# Patient Record
Sex: Female | Born: 1935 | Race: White | Hispanic: No | Smoking: Never smoker
Health system: Southern US, Community
[De-identification: ages and names within clinical notes are randomized; demographics above are authoritative.]

## PROBLEM LIST (undated history)

## (undated) DIAGNOSIS — I4891 Unspecified atrial fibrillation: Secondary | ICD-10-CM

## (undated) DIAGNOSIS — Z8719 Personal history of other diseases of the digestive system: Secondary | ICD-10-CM

## (undated) DIAGNOSIS — E785 Hyperlipidemia, unspecified: Secondary | ICD-10-CM

## (undated) DIAGNOSIS — K529 Noninfective gastroenteritis and colitis, unspecified: Secondary | ICD-10-CM

## (undated) DIAGNOSIS — I499 Cardiac arrhythmia, unspecified: Secondary | ICD-10-CM

## (undated) DIAGNOSIS — I10 Essential (primary) hypertension: Secondary | ICD-10-CM

## (undated) HISTORY — DX: Personal history of other diseases of the digestive system: Z87.19

## (undated) HISTORY — DX: Hyperlipidemia, unspecified: E78.5

## (undated) HISTORY — DX: Cardiac arrhythmia, unspecified: I49.9

## (undated) HISTORY — DX: Noninfective gastroenteritis and colitis, unspecified: K52.9

## (undated) HISTORY — DX: Essential (primary) hypertension: I10

---

## 1997-11-20 ENCOUNTER — Ambulatory Visit (HOSPITAL_BASED_OUTPATIENT_CLINIC_OR_DEPARTMENT_OTHER): Admission: RE | Admit: 1997-11-20 | Discharge: 1997-11-20 | Payer: Self-pay | Admitting: General Surgery

## 1999-06-18 ENCOUNTER — Emergency Department (HOSPITAL_COMMUNITY): Admission: EM | Admit: 1999-06-18 | Discharge: 1999-06-18 | Payer: Self-pay | Admitting: Emergency Medicine

## 1999-06-18 ENCOUNTER — Encounter: Payer: Self-pay | Admitting: Emergency Medicine

## 1999-07-04 ENCOUNTER — Ambulatory Visit (HOSPITAL_COMMUNITY): Admission: RE | Admit: 1999-07-04 | Discharge: 1999-07-04 | Payer: Self-pay | Admitting: Internal Medicine

## 1999-08-16 ENCOUNTER — Encounter: Admission: RE | Admit: 1999-08-16 | Discharge: 1999-08-16 | Payer: Self-pay | Admitting: Internal Medicine

## 1999-08-16 ENCOUNTER — Encounter: Payer: Self-pay | Admitting: Internal Medicine

## 2000-02-01 ENCOUNTER — Encounter: Admission: RE | Admit: 2000-02-01 | Discharge: 2000-02-01 | Payer: Self-pay | Admitting: General Surgery

## 2000-02-01 ENCOUNTER — Encounter: Payer: Self-pay | Admitting: General Surgery

## 2001-02-14 ENCOUNTER — Encounter: Admission: RE | Admit: 2001-02-14 | Discharge: 2001-02-14 | Payer: Self-pay | Admitting: General Surgery

## 2001-02-14 ENCOUNTER — Encounter: Payer: Self-pay | Admitting: General Surgery

## 2002-02-21 ENCOUNTER — Encounter: Payer: Self-pay | Admitting: Emergency Medicine

## 2002-02-21 ENCOUNTER — Inpatient Hospital Stay (HOSPITAL_COMMUNITY): Admission: EM | Admit: 2002-02-21 | Discharge: 2002-02-25 | Payer: Self-pay | Admitting: Emergency Medicine

## 2002-04-17 ENCOUNTER — Encounter: Payer: Self-pay | Admitting: Internal Medicine

## 2002-04-17 ENCOUNTER — Ambulatory Visit (HOSPITAL_COMMUNITY): Admission: RE | Admit: 2002-04-17 | Discharge: 2002-04-17 | Payer: Self-pay | Admitting: Internal Medicine

## 2002-06-11 ENCOUNTER — Encounter: Admission: RE | Admit: 2002-06-11 | Discharge: 2002-06-11 | Payer: Self-pay | Admitting: Internal Medicine

## 2002-06-11 ENCOUNTER — Encounter: Payer: Self-pay | Admitting: Internal Medicine

## 2002-08-04 ENCOUNTER — Ambulatory Visit (HOSPITAL_COMMUNITY): Admission: RE | Admit: 2002-08-04 | Discharge: 2002-08-04 | Payer: Self-pay | Admitting: Internal Medicine

## 2002-08-04 ENCOUNTER — Encounter: Payer: Self-pay | Admitting: Internal Medicine

## 2003-06-29 ENCOUNTER — Encounter: Admission: RE | Admit: 2003-06-29 | Discharge: 2003-06-29 | Payer: Self-pay | Admitting: Internal Medicine

## 2004-07-19 ENCOUNTER — Encounter: Admission: RE | Admit: 2004-07-19 | Discharge: 2004-07-19 | Payer: Self-pay | Admitting: Internal Medicine

## 2005-08-22 ENCOUNTER — Ambulatory Visit (HOSPITAL_COMMUNITY): Admission: RE | Admit: 2005-08-22 | Discharge: 2005-08-22 | Payer: Self-pay | Admitting: Internal Medicine

## 2005-09-07 ENCOUNTER — Encounter: Admission: RE | Admit: 2005-09-07 | Discharge: 2005-09-07 | Payer: Self-pay | Admitting: Internal Medicine

## 2005-11-23 ENCOUNTER — Other Ambulatory Visit: Admission: RE | Admit: 2005-11-23 | Discharge: 2005-11-23 | Payer: Self-pay | Admitting: Internal Medicine

## 2005-12-21 ENCOUNTER — Inpatient Hospital Stay (HOSPITAL_COMMUNITY): Admission: EM | Admit: 2005-12-21 | Discharge: 2005-12-26 | Payer: Self-pay | Admitting: Emergency Medicine

## 2005-12-25 ENCOUNTER — Ambulatory Visit: Payer: Self-pay | Admitting: Physical Medicine & Rehabilitation

## 2005-12-26 ENCOUNTER — Ambulatory Visit: Payer: Self-pay | Admitting: Family Medicine

## 2006-01-10 ENCOUNTER — Ambulatory Visit: Payer: Self-pay | Admitting: Family Medicine

## 2006-02-06 ENCOUNTER — Encounter: Admission: RE | Admit: 2006-02-06 | Discharge: 2006-02-06 | Payer: Self-pay | Admitting: Neurological Surgery

## 2006-03-06 ENCOUNTER — Encounter: Admission: RE | Admit: 2006-03-06 | Discharge: 2006-03-06 | Payer: Self-pay | Admitting: Anesthesiology

## 2006-04-03 ENCOUNTER — Encounter: Admission: RE | Admit: 2006-04-03 | Discharge: 2006-04-03 | Payer: Self-pay | Admitting: Neurological Surgery

## 2006-04-30 ENCOUNTER — Encounter: Admission: RE | Admit: 2006-04-30 | Discharge: 2006-04-30 | Payer: Self-pay | Admitting: Neurological Surgery

## 2006-09-12 ENCOUNTER — Encounter: Admission: RE | Admit: 2006-09-12 | Discharge: 2006-09-12 | Payer: Self-pay | Admitting: Internal Medicine

## 2006-09-21 ENCOUNTER — Encounter: Admission: RE | Admit: 2006-09-21 | Discharge: 2006-09-21 | Payer: Self-pay | Admitting: Internal Medicine

## 2007-10-03 ENCOUNTER — Encounter: Admission: RE | Admit: 2007-10-03 | Discharge: 2007-10-03 | Payer: Self-pay | Admitting: Internal Medicine

## 2007-10-17 HISTORY — PX: DOPPLER ECHOCARDIOGRAPHY: SHX263

## 2007-10-17 HISTORY — PX: OTHER SURGICAL HISTORY: SHX169

## 2007-11-07 ENCOUNTER — Inpatient Hospital Stay (HOSPITAL_COMMUNITY): Admission: EM | Admit: 2007-11-07 | Discharge: 2007-11-15 | Payer: Self-pay | Admitting: Emergency Medicine

## 2007-12-10 ENCOUNTER — Encounter (INDEPENDENT_AMBULATORY_CARE_PROVIDER_SITE_OTHER): Payer: Self-pay | Admitting: Cardiology

## 2007-12-10 ENCOUNTER — Ambulatory Visit (HOSPITAL_COMMUNITY): Admission: RE | Admit: 2007-12-10 | Discharge: 2007-12-10 | Payer: Self-pay | Admitting: Cardiology

## 2007-12-10 HISTORY — PX: TRANSESOPHAGEAL ECHOCARDIOGRAM: SHX273

## 2008-11-26 ENCOUNTER — Ambulatory Visit (HOSPITAL_COMMUNITY): Admission: RE | Admit: 2008-11-26 | Discharge: 2008-11-26 | Payer: Self-pay | Admitting: Internal Medicine

## 2009-04-19 ENCOUNTER — Encounter: Admission: RE | Admit: 2009-04-19 | Discharge: 2009-04-19 | Payer: Self-pay | Admitting: Family Medicine

## 2009-08-14 ENCOUNTER — Emergency Department (HOSPITAL_COMMUNITY): Admission: EM | Admit: 2009-08-14 | Discharge: 2009-08-14 | Payer: Self-pay | Admitting: Emergency Medicine

## 2009-09-11 ENCOUNTER — Emergency Department (HOSPITAL_COMMUNITY): Admission: EM | Admit: 2009-09-11 | Discharge: 2009-09-11 | Payer: Self-pay | Admitting: Family Medicine

## 2009-12-24 ENCOUNTER — Encounter: Admission: RE | Admit: 2009-12-24 | Discharge: 2009-12-24 | Payer: Self-pay | Admitting: Family Medicine

## 2010-06-16 IMAGING — CT CT ABD-PELV W/ CM
3 of 5 series · 12 of 36 positions shown, 19 images · IV contrast (water/omni  & 80ml omni 300)
Comparison: 12/23/2005

CLINICAL DATA: Left lower quadrant abdominal pain and flank pain.
Diarrhea and constipation.

CT ABDOMEN AND PELVIS WITH CONTRAST
TECHNIQUE: Multidetector CT imaging of the abdomen and pelvis was
performed following the standard protocol during bolus
administration of intravenous contrast.
Contrast: 80 ml Omniscan 300 IV contrast

[Series 2: routine abdomen · axial · 0.74mm/px · z∈[-387,-27]mm · 9 of 90 slices shown, 15 images]
[im 9/90  soft-tissue]
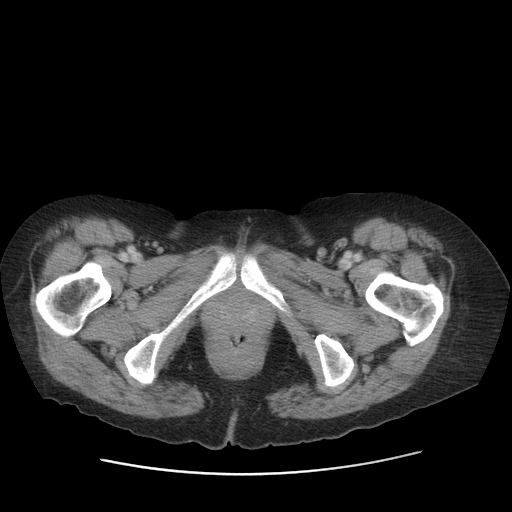
[im 9/90  bone]
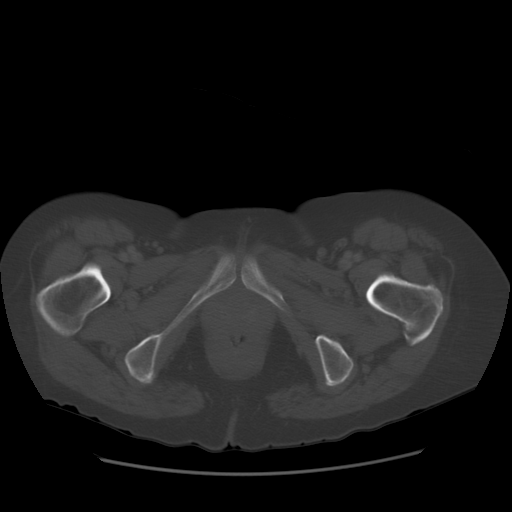
[im 18/90  soft-tissue]
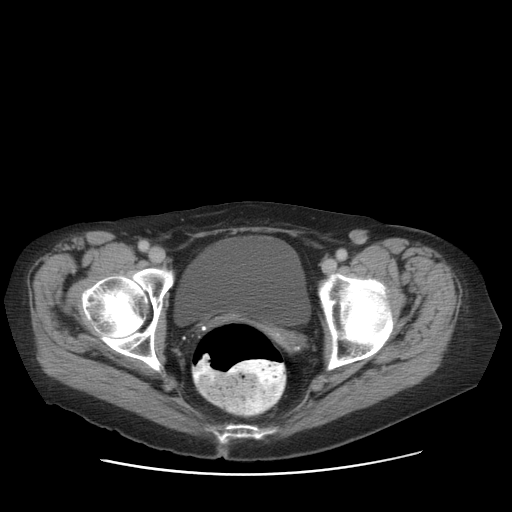
[im 27/90  soft-tissue]
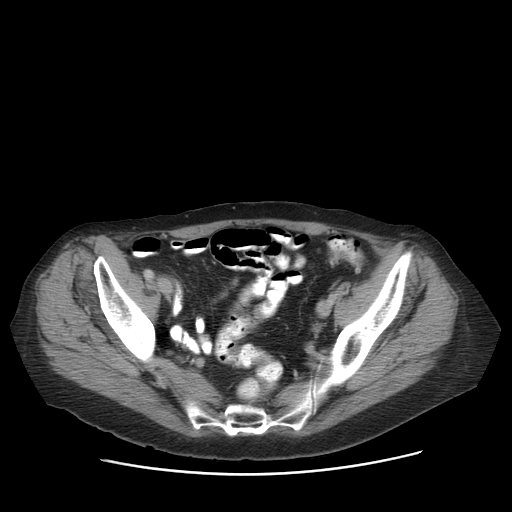
[im 36/90  soft-tissue]
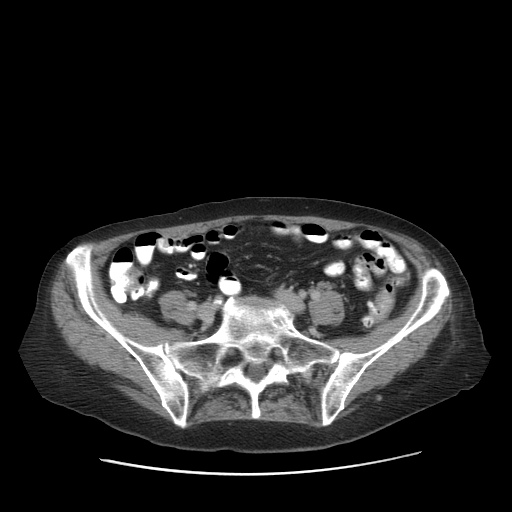
[im 45/90  soft-tissue]
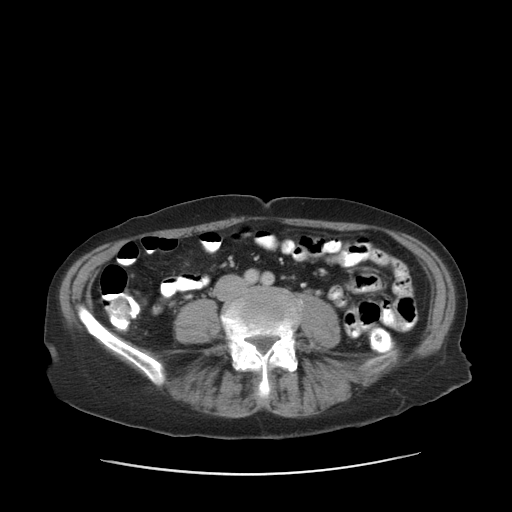
[im 54/90  soft-tissue]
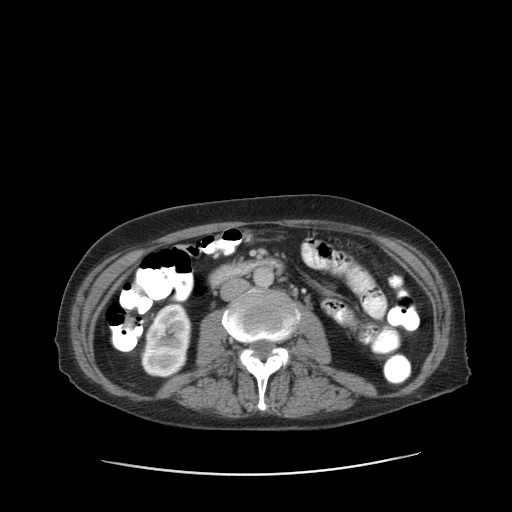
[im 54/90  lung]
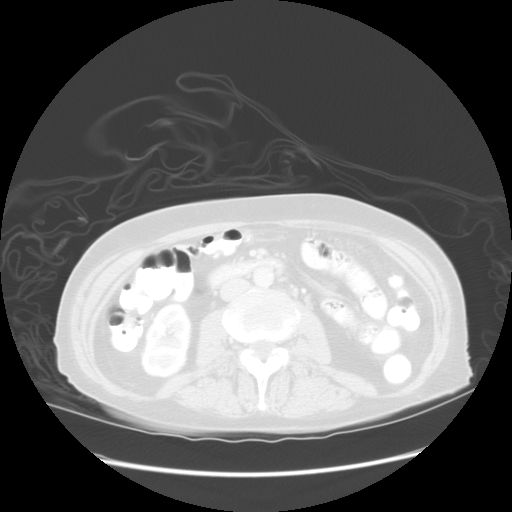
[im 63/90  soft-tissue]
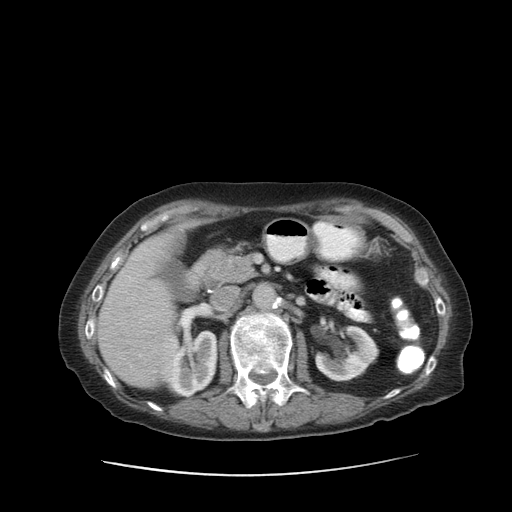
[im 63/90  lung]
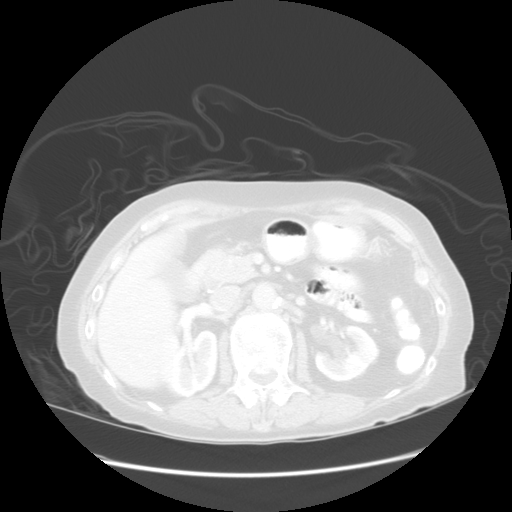
[im 72/90  soft-tissue]
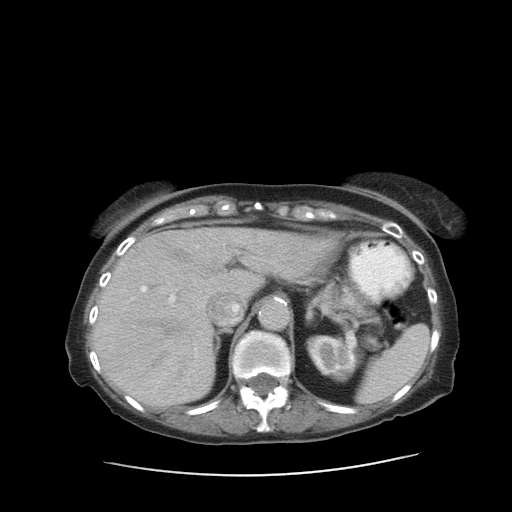
[im 72/90  lung]
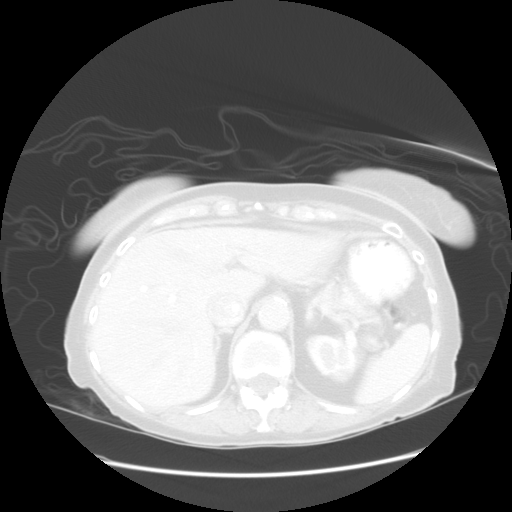
[im 81/90  soft-tissue]
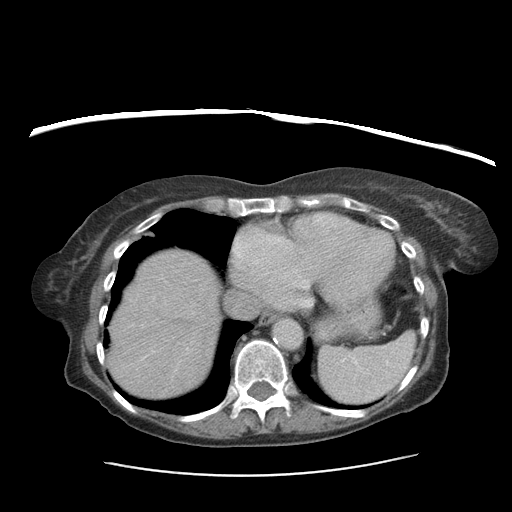
[im 81/90  lung]
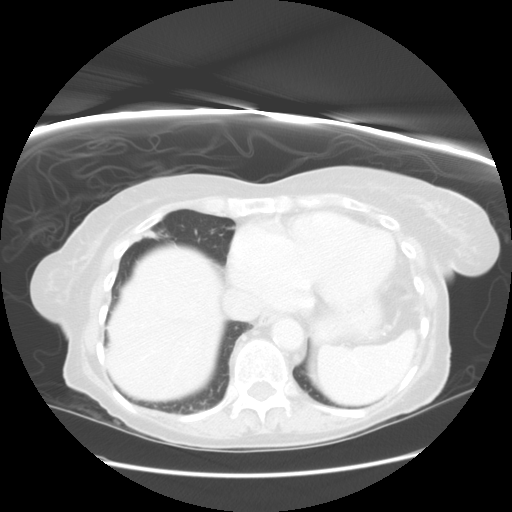
[im 81/90  bone]
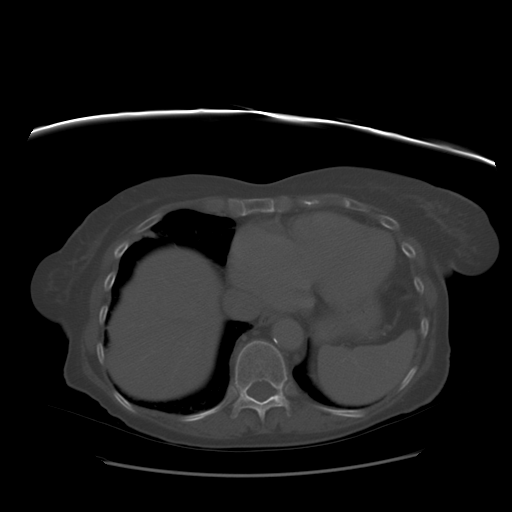

[Series 400: sag · sagittal · 0.89mm/px · 2 of 103 slices shown]
[im 9/103  soft-tissue]
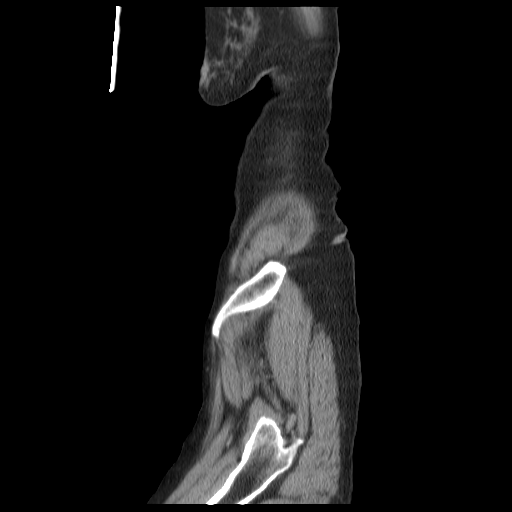
[im 26/103  soft-tissue]
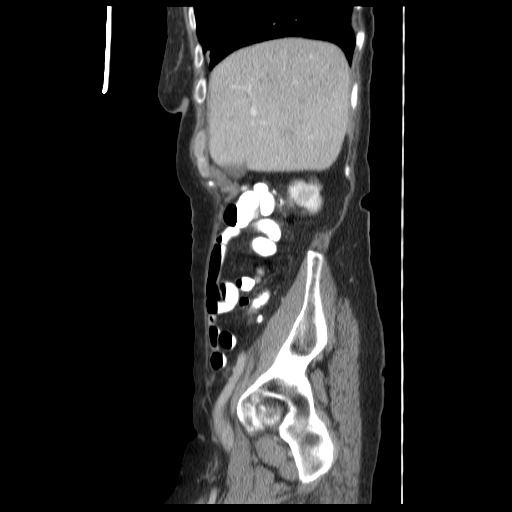

[Series 401: cor · coronal · 0.89mm/px · 1 of 70 slices shown, 2 images]
[im 24/70  soft-tissue]
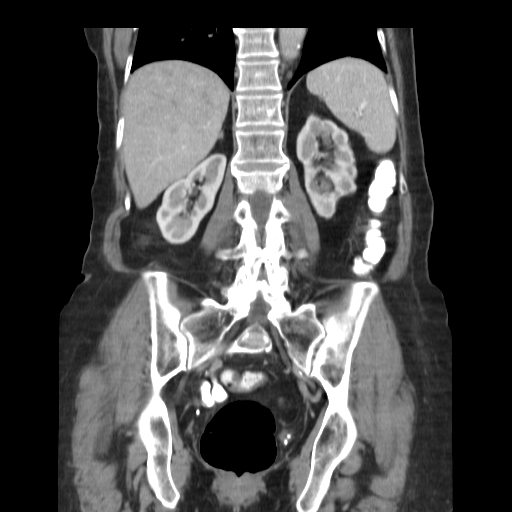
[im 24/70  bone]
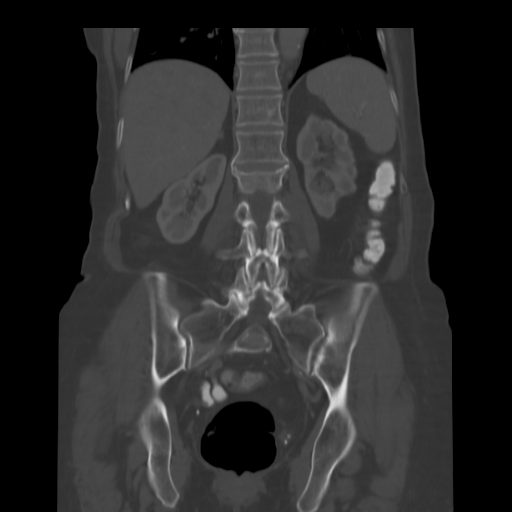

[12 of 36 positions shown; findings below may reference images not displayed]

FINDINGS: An area of tree in bud nodularity is noted in the right
middle lobe.  Linear left lower lobe scarring is noted.

Areas of left renal cortical scarring are again noted.  No
hydronephrosis.  The liver, gallbladder, spleen, pancreas, and
adrenal glands are unremarkable.  Small retroperitoneal lymph nodes
are within normal limits for size.  Cardiomegaly again noted.

Scattered atherosclerotic aortic calcification noted without
aneurysm.  No radiopaque renal or ureteral calculus.  Large volume
of stool noted within the rectum.  Scattered colonic diverticuli
noted without evidence for diverticulitis.  The appendix is normal.
Uterus is surgically absent.  Neither ovary is visualized.  No
acute bony abnormality.  Lumbar spine degenerative changes present.
L2 compression fracture again noted.
IMPRESSION: Large volume of stool in the rectal vault.  Otherwise no acute
finding in the abdomen or pelvis.

Tree in bud nodular opacities in the right middle lobe may reflect
small airways infection, possibly TRAITEUR.

## 2010-07-03 ENCOUNTER — Encounter: Payer: Self-pay | Admitting: Internal Medicine

## 2010-09-05 LAB — CBC
HCT: 41.5 % (ref 36.0–46.0)
MCV: 87.8 fL (ref 78.0–100.0)
Platelets: 160 10*3/uL (ref 150–400)
RDW: 14 % (ref 11.5–15.5)

## 2010-09-05 LAB — URINALYSIS, ROUTINE W REFLEX MICROSCOPIC
Ketones, ur: NEGATIVE mg/dL
Nitrite: NEGATIVE
Protein, ur: NEGATIVE mg/dL
Urobilinogen, UA: 0.2 mg/dL (ref 0.0–1.0)
pH: 8 (ref 5.0–8.0)

## 2010-09-05 LAB — DIFFERENTIAL
Basophils Absolute: 0 10*3/uL (ref 0.0–0.1)
Eosinophils Absolute: 0.1 10*3/uL (ref 0.0–0.7)
Eosinophils Relative: 3 % (ref 0–5)
Lymphs Abs: 1.6 10*3/uL (ref 0.7–4.0)
Monocytes Absolute: 0.3 10*3/uL (ref 0.1–1.0)

## 2010-09-05 LAB — BASIC METABOLIC PANEL
BUN: 14 mg/dL (ref 6–23)
CO2: 30 mEq/L (ref 19–32)
Chloride: 103 mEq/L (ref 96–112)
Creatinine, Ser: 1.06 mg/dL (ref 0.4–1.2)
Glucose, Bld: 102 mg/dL — ABNORMAL HIGH (ref 70–99)

## 2010-09-05 LAB — PROTIME-INR: Prothrombin Time: 21.1 seconds — ABNORMAL HIGH (ref 11.6–15.2)
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
## 2010-10-25 NOTE — Discharge Summary (Signed)
NAME:  Rebecca Bradshaw, HARRIES NO.:  [id number]   MEDICAL RECORD NO.:  [id number]          PATIENT TYPE:  INP   LOCATION:  1429                         FACILITY:  [facility]   PHYSICIAN:  Marcellus Scott, MD     DATE OF BIRTH:  05/08/1931   DATE OF ADMISSION:  11/06/2007  DATE OF DISCHARGE:  11/15/2007                               DISCHARGE SUMMARY   PRIMARY CARE PHYSICIAN:  Lucky Cowboy, M.D.   CARDIOLOGIST:  Cristy Hilts. Jacinto Halim, M.D.   DISCHARGE DIAGNOSES:  1. Chronic atrial fibrillation.  2. Iron Deficiency Anemia.  Gastrointestinal workup negative.  3. Weakness and deconditioning.  4. Hypothyroidism.  5. Essential tremor.  6. Rule out subclinical hypothyroidism.   DISCHARGE MEDICATIONS:  1. Pravastatin 40 mg p.o. daily.  2. Metoprolol 25 mg p.o. twice daily.  3. Aspirin 81 mg p.o. daily.  4. Meclizine 25 mg p.o. t.i.d. p.r.n.  5. Calcium 500 with vitamin D one p.o. daily.  6. Vitamin D3, 1000 international units p.o. daily.  7. Protonix 40 mg p.o. 2 times daily.  8. Amlodipine 5 mg p.o. daily.  9. Valium 2.5 mg p.o. t.i.d.  10.Coumadin 2.5 mg tablets, 5 mg p.o. at 6:00 p.m. on November 15, 2007 and      November 16, 2007; 2.5 mg on November 17, 2007 at 6:00 p.m.  Home health RN      will draw labs- INR, on November 18, 2007 and relay to Dr. Jacinto Halim for      further dosing instructions.   DISCONTINUED MEDICATIONS:  1. Verapamil.  2. Diazepam 5 mg.  3. Potassium chloride.  4. Bumex.   PROCEDURES:  1. EGD on November 12, 2007.  Normal study.  2. Colonoscopy on November 12, 2007.  Impression:  Diverticulosis,      otherwise normal colonoscopy.  3. CT of the head without contrast on Nov 06, 2007.  Mild diffuse      cortical atrophy.  No acute abnormality.  4. Chest x-ray on Nov 06, 2007.  Mild CHF with small effusions.   PERTINENT LABS:  INR today is 1.6.  CBC:  Hemoglobin 11.5, hematocrit  35, white blood cell 4.3, platelets 188.  Fecal occult blood testing was  negative.  Urine culture with  multiple bacteria, morphocytes.  Free T3  was 2.6, TSH was 5.170.  Anemia panel with B12 478, serum folate greater  than 20, ferritin 31.  TSH of 7.411 initially.   CONSULTATIONS:  1. Cardiology, Dr. Alanda Amass.  2. GI, Dr. Charlott Rakes.   HOSPITAL COURSE AND PATIENT DISPOSITION:  Please refer to the history  and physical note for initial admission details.  In summary, Ms.  Steffler is a 77 year old female patient with a history of a fib,  hypertension, hyperlipidemia, chronic vertigo, benign essential tremors  who presented to the emergency room with history of generalized weakness  for a couple of days.  This weakness was thought to be multifactorial,  including medications, mild congestive heart failure.  There was no  obvious sepsis.  The patient was thereby admitted for further evaluation  and management.   Cardiology  evaluated her and thought that she had a slow ventricular  response.  Her verapamil was decreased and subsequently discontinued.  She was also noted to have significant iron-deficiency anemia, though  she was Hemoccult negative following which the patient had a GI  evaluation done with a workup which was negative.  In the interim, she  was on IV heparin drip, and Coumadin and was reversed.  Once the  colonoscopy and EGD were completed, she was resumed on IV heparin drip,  and the Coumadin has been reinstated.  She is still not therapeutic on  the Coumadin.  I have discussed her case with Dr. Jacinto Halim who has  recommended that the IV heparin can be stopped, and she can be sent home  after a dose of Lovenox to slow anticoagulate as an outpatient.  He will  follow her INR on Monday, June 8 to be drawn by the home health RN.  This anemia might have been a contributory factor for her generalized  weakness.  At one point, cardiology even planned cardioversion but has  not been done, and this will be left to be achieved as an outpatient.  The patient is continued on her  Valium which she is getting for her  essential tremors.  The patient is tolerating low-dose beta blockers  with good control of her heart rate.  She has had PT/OT evaluate her.   The patient at this time is stable to be discharged home.  She will go  to stay with her daughter with 24/7 supervision and home health PT.  The  patient was noted to have elevated TSH but normal free T4 and T3 levels,  suspicious for subclinical hypothyroidism.  To consider starting her on  low-dose Synthroid as an outpatient.  Also, the patient is to get  diuretics on a p.r.n. basis.      Marcellus Scott, MD  Electronically Signed     AH/MEDQ  D:  11/15/2007  T:  11/15/2007  Job:  [id number]   cc:   Lucky Cowboy, M.D.  Fax: [phone]   Cristy Hilts. Jacinto Halim, MD  Fax: [phone]   Shirley Friar, MD  Fax: [phone]

## 2010-10-25 NOTE — Op Note (Signed)
NAME:  Rebecca Bradshaw, Rebecca Bradshaw NO.:  [id number]   MEDICAL RECORD NO.:  [id number]          PATIENT TYPE:  INP   LOCATION:  1429                         FACILITY:  [facility]   PHYSICIAN:  John C. Madilyn Fireman, M.D.    DATE OF BIRTH:  10/11/1930   DATE OF PROCEDURE:  11/12/2007  DATE OF DISCHARGE:                               OPERATIVE REPORT   INDICATIONS FOR PROCEDURE:  Anemia in a patient with no previous  colonoscopy, who is on Coumadin chronically.   PROCEDURE:  The patient was placed in the left lateral decubitus  position and placed on the pulse monitor, with continuous low-flow  oxygen delivered by nasal cannula.  She was sedated with 50 mcg IV  fentanyl and 7 mg IV Versed.  The Olympus video colonoscope was inserted  into the rectum and advanced to the cecum, confirmed by  transillumination at McBurney's point and visualization of ileocecal  valve and appendiceal orifice.  The prep was excellent.  The cecum,  ascending, transverse, and descending colon all appeared normal, with no  masses, polyps, diverticula, or other mucosal abnormalities.  Within the  sigmoid colon, there were a few scattered diverticula, no other  abnormalities.  The rectum appeared normal, and retroflexed view of the  anus revealed no obvious internal hemorrhoids.  The scope was then  withdrawn, and the patient returned to the recovery room in stable  condition.  She tolerated the procedure well.  There were no immediate  complications.   IMPRESSION:  Diverticulosis, otherwise normal colonoscopy.   PLAN:  Resume anticoagulation as needed.           ______________________________  Everardo All Madilyn Fireman, M.D.     JCH/MEDQ  D:  11/12/2007  T:  11/12/2007  Job:  [id number]
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
## 2010-10-25 NOTE — Op Note (Signed)
NAME:  Rebecca Bradshaw, Rebecca Bradshaw NO.:  [id number]   MEDICAL RECORD NO.:  [id number]          PATIENT TYPE:  INP   LOCATION:  1429                         FACILITY:  [facility]   PHYSICIAN:  John C. Madilyn Fireman, M.D.    DATE OF BIRTH:  05/15/1931   DATE OF PROCEDURE:  11/12/2007  DATE OF DISCHARGE:                               OPERATIVE REPORT   PROCEDURE:  Esophagogastroduodenoscopy.   INDICATIONS FOR PROCEDURE:  Anemia in a patient on chronic Coumadin.   PROCEDURE:  The patient was placed in the left lateral decubitus  position and placed on the pulse monitor, with continuous low-flow  oxygen delivered by nasal cannula.  She was sedated with 50 mcg IV  fentanyl and 7 mg IV Versed.  The Olympus video endoscope was advanced  under direct vision into the oropharynx and esophagus.  The esophagus  was straight and of normal caliber, with the squamocolumnar line at 38  cm.  There was no visible hiatal hernia, ring, stricture, or other  abnormality of the GE junction.  The stomach was entered, and a small  amount of liquid secretions were suctioned from the fundus.  Retroflexed  view of the cardia was unremarkable.  The fundus, body, antrum, and  pylorus all appeared normal.  The duodenum was entered, and both the  bulb and second portion were well inspected and appeared to be within  normal limits.  The scope was then withdrawn and the patient prepared  for colonoscopy.  She tolerated the procedure well, and there were no  immediate complications.   IMPRESSION:  Normal study.           ______________________________  Everardo All Madilyn Fireman, M.D.     JCH/MEDQ  D:  11/12/2007  T:  11/12/2007  Job:  [id number]
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
## 2010-10-28 NOTE — H&P (Signed)
NAME:  AAMORI, MCMASTERS NO.:  [id number]   MEDICAL RECORD NO.:  [id number]                   PATIENT TYPE:  INP   LOCATION:  3016                                 FACILITY:  [facility]   PHYSICIAN:  Theressa Millard, MD                   DATE OF BIRTH:  29-Aug-1930   DATE OF ADMISSION:  02/21/2002  DATE OF DISCHARGE:                                HISTORY & PHYSICAL   CHIEF COMPLAINT:  Vertigo/dizziness to the extent that she can not sit up  because of that symptom.  It is associated with nausea.  They stopped her  hormone replacement therapy three weeks ago.  She has been acutely dizzy  since Tuesday.  She also has a headache in the back of her head and neck.   HISTORY OF PRESENT ILLNESS:  Since Tuesday, a flare of what is felt to be  due to inner ear disturbance.  No improvement on Valium 5 milligrams t.i.d.,  so she was brought in for further evaluation.   DRUG ALLERGIES:  Codeine caused nausea.   CURRENT MEDICATIONS:  Verapamil 240 one q.d., Bumex 1 milligram q.d.,  Atenolol 50 milligrams one q.d., Os-Cal D, Aspirin 81 milligrams one q.d.,  Primidone 50 milligrams one-half tablet q.h.s.   PAST MEDICAL HISTORY:  1. Hypertension.  2. Fibrocystic breast disease.  3. Renal lithiasis.  4. Inner ear problem (Dr. Jearld Fenton), benign positional vertigo, chronic sinus     problem, status-post sinus surgery in 1997.  5. Gastroesophageal reflux disease, mild.  6. Varicose veins.   PAST SURGICAL HISTORY:  1. Total abdominal hysterectomy, bilateral salpingo-oophorectomy in 1999     (fibroid uterus).  2. Breast biopsy right, benign, in 1999 (Dr. Francina Ames).  3. Sinus surgery in 1997 (Dr. Jearld Fenton).   FAMILY HISTORY:  Father died of a myocardial infarction and hypertension at  age 5.  Mother died of breast cancer at age 84.  She has three sisters, one  is deceased.  One has diabetes.  Two others also are diabetic.  One brother  is diabetic.  One brother has  melanoma.   HABITS:  Smoking none.  Drugs/alcohol none.   SOCIAL HISTORY:  Married with two children.  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   REVIEW OF SYSTEMS:  Please see chief complaint with no other additional  problems.   PHYSICAL EXAMINATION:  GENERAL APPEARANCE:  Obese elderly female unable to  sit up due to vertigo.  VITAL SIGNS:  Blood pressure 148/92, pulse 68, respirations 24, temperature  98.6, height 5'9, weight 200 pounds.  HEAD, EYES, EARS, NOSE, THROAT:  Pupils round and reactive to light.  Extraocular movements are intact.  Funduscopic normal.  Ears revealed  external auditory canals/tympanic membranes clear.  Oropharynx is moist.  NECK:  Supple.  CHEST:  Clear to percussion and auscultation.  HEART:  Regular rate and rhythm without S3 or S4.  No murmurs, rubs,  gallops, or clicks.  ABDOMEN:  Benign.  Bowel sounds present.  BREASTS:  No dominant masses or nipple discharge.  GYN:  Deferred.  RECTAL:  Deferred.  EXTREMITIES:  No edema.  NEUROLOGIC:  Alert and oriented times three.  Cranial nerves II through XII  intact.  Vertigo upon sitting (severe ).   ASSESSMENT:  Vertigo, questionable etiology.  Admit per orders of Dr. Theressa Millard to include a CT of the brain noncontrast.     Lavinia Sharps, NP                       Theressa Millard, MD    MAP/MEDQ  D:  02/21/2002  T:  02/23/2002  Job:  [id number]
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
## 2010-10-28 NOTE — Discharge Summary (Signed)
   NAME:  Rebecca Bradshaw, Rebecca Bradshaw                        ACCOUNT NO.:  [id number]   MEDICAL RECORD NO.:  [id number]                   PATIENT TYPE:  INP   LOCATION:  3016                                 FACILITY:  [facility]   PHYSICIAN:  Georgann Housekeeper, M.D.                 DATE OF BIRTH:  11/26/1930   DATE OF ADMISSION:  02/21/2002  DATE OF DISCHARGE:  02/25/2002                                 DISCHARGE SUMMARY   CONDITION ON DISCHARGE:  Stable.   DISCHARGE DIAGNOSES:  1. 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  2. Maxillary sinusitis.   DISCHARGE MEDICATIONS:  1. Verapamil 240 mg q.d.  2. Bumex 1 mg q.d.  3. Tenormin 50 mg q.d.  4. Aspirin 81 mg q.d.  5. Tequin 400 mg once a day x7 days.  6. Prednisone taper 40 mg x2 days, 20 mg x2 days and 10 mg x2 days.  7. Afrin nasal spray once twice a day x3 days as needed.  8. Valium 5 mg q.8h. p.r.n.  9. Antevert 25 mg q.8h. as needed.  10.      Potassium 10 mEq once a day.   ACTIVITY:  As tolerated.  Assistance with walking.   FOLLOW UP:  Follow-up appointment with Dr. Jearld Fenton, ENT, in two weeks.  Follow  up with Dr. Donette Larry in one week.   HOSPITAL COURSE:  The patient is 77 years old with history of vertigo in the  past and worked up by Dr. Jearld Fenton in 1997 and 1998.  He started having  symptoms of vertigo with dizziness, unable to stand up lasting more than  three to four days.  The patient was seen in the clinic and admitted for the  severe vertigo and nausea.  Her lab work was unremarkable except her  potassium was 3.1 which was replaced.  She underwent scan of the head and CT  was negative except for maxillary sinusitis.  She was also evaluated by ENT  and started on antibiotics and prednisone thought to be secondary to  vestibular neuronitis and sinusitis and agrees with the supportive care.  The patient has improved with prednisone, valium and antibiotics.  She is  discharged home with a follow-up appointment with Dr. Jearld Fenton as an  outpatient.  Regarding the patient's hypertension, the patient remained  stable with current medications for blood pressure.  She had a history of  essential tremors.                                                Georgann Housekeeper, M.D.    KH/MEDQ  D:  03/20/2002  T:  03/23/2002  Job:  [id number]

## 2010-12-22 ENCOUNTER — Other Ambulatory Visit (HOSPITAL_COMMUNITY): Payer: Self-pay | Admitting: Family Medicine

## 2010-12-22 ENCOUNTER — Other Ambulatory Visit: Payer: Self-pay | Admitting: Family Medicine

## 2010-12-22 DIAGNOSIS — Z1231 Encounter for screening mammogram for malignant neoplasm of breast: Secondary | ICD-10-CM

## 2011-01-05 ENCOUNTER — Ambulatory Visit
Admission: RE | Admit: 2011-01-05 | Discharge: 2011-01-05 | Disposition: A | Discharge status: Home / Self Care | Payer: MEDICARE | Source: Ambulatory Visit | Attending: Family Medicine | Admitting: Family Medicine

## 2011-01-05 DIAGNOSIS — Z1231 Encounter for screening mammogram for malignant neoplasm of breast: Secondary | ICD-10-CM

## 2011-03-08 LAB — CBC
HCT: 28.4 — ABNORMAL LOW
HCT: 28.8 — ABNORMAL LOW
HCT: 29.9 — ABNORMAL LOW
HCT: 30 — ABNORMAL LOW
HCT: 32.3 — ABNORMAL LOW
Hemoglobin: 10.1 — ABNORMAL LOW
Hemoglobin: 10.6 — ABNORMAL LOW
Hemoglobin: 9.5 — ABNORMAL LOW
Hemoglobin: 9.8 — ABNORMAL LOW
MCHC: 32.8
MCHC: 34.6
MCV: 82.1
MCV: 83.6
RBC: 3.43 — ABNORMAL LOW
RBC: 3.52 — ABNORMAL LOW
RBC: 3.56 — ABNORMAL LOW
RBC: 3.63 — ABNORMAL LOW
RBC: 3.86 — ABNORMAL LOW
RDW: 14.2
WBC: 4.6
WBC: 4.7

## 2011-03-08 LAB — COMPREHENSIVE METABOLIC PANEL
ALT: 28
AST: 29
CO2: 23
Calcium: 8.2 — ABNORMAL LOW
Chloride: 107
GFR calc non Af Amer: 47 — ABNORMAL LOW
Glucose, Bld: 96
Sodium: 138
Total Bilirubin: 0.5

## 2011-03-08 LAB — DIFFERENTIAL
Basophils Relative: 0
Eosinophils Absolute: 0.1
Eosinophils Relative: 3
Eosinophils Relative: 3
Eosinophils Relative: 4
Lymphocytes Relative: 28
Lymphocytes Relative: 31
Lymphs Abs: 1.4
Monocytes Absolute: 0.5
Monocytes Absolute: 0.5
Monocytes Relative: 10
Monocytes Relative: 11
Monocytes Relative: 11
Neutro Abs: 2.5
Neutro Abs: 3.1
Neutrophils Relative %: 53

## 2011-03-08 LAB — POCT I-STAT, CHEM 8
Calcium, Ion: 1.09 — ABNORMAL LOW
Creatinine, Ser: <0.2 — ABNORMAL LOW
Glucose, Bld: 118 — ABNORMAL HIGH
HCT: 30 — ABNORMAL LOW
Hemoglobin: 10.2 — ABNORMAL LOW
Potassium: 4.4
TCO2: 10

## 2011-03-08 LAB — PROTIME-INR
INR: 2.8 — ABNORMAL HIGH
INR: 2.9 — ABNORMAL HIGH
INR: 3 — ABNORMAL HIGH
Prothrombin Time: 21.3 — ABNORMAL HIGH
Prothrombin Time: 30.7 — ABNORMAL HIGH

## 2011-03-08 LAB — HEPATIC FUNCTION PANEL
ALT: 30
Alkaline Phosphatase: 53
Bilirubin, Direct: 0.1
Indirect Bilirubin: 0.4
Total Bilirubin: 0.5
Total Protein: 5.7 — ABNORMAL LOW

## 2011-03-08 LAB — CARDIAC PANEL(CRET KIN+CKTOT+MB+TROPI)
CK, MB: 0.7
Relative Index: INVALID
Total CK: 44
Total CK: 53
Troponin I: <0.01

## 2011-03-08 LAB — IRON AND TIBC
Saturation Ratios: 9 — ABNORMAL LOW
TIBC: 373
UIBC: 340

## 2011-03-08 LAB — FERRITIN: Ferritin: 31 (ref 10–291)

## 2011-03-08 LAB — URINALYSIS, ROUTINE W REFLEX MICROSCOPIC
Bilirubin Urine: NEGATIVE
Hgb urine dipstick: NEGATIVE
Specific Gravity, Urine: 1.019
Urobilinogen, UA: 0.2
pH: 6

## 2011-03-08 LAB — POCT CARDIAC MARKERS: CKMB, poc: <1 — ABNORMAL LOW

## 2011-03-08 LAB — B-NATRIURETIC PEPTIDE (CONVERTED LAB): Pro B Natriuretic peptide (BNP): 75.4

## 2011-03-08 LAB — FOLATE: Folate: >20

## 2011-03-08 LAB — TSH: TSH: 5.17

## 2011-03-08 LAB — OCCULT BLOOD X 1 CARD TO LAB, STOOL: Fecal Occult Bld: NEGATIVE

## 2011-03-08 LAB — CK TOTAL AND CKMB (NOT AT ARMC): CK, MB: 0.5

## 2011-03-08 LAB — URINE CULTURE

## 2011-03-08 LAB — MAGNESIUM: Magnesium: 2.1

## 2011-03-08 LAB — RETICULOCYTES: Retic Ct Pct: 0.9

## 2011-03-09 LAB — PROTIME-INR
INR: 1.4
INR: 1.6 — ABNORMAL HIGH
INR: 1.6 — ABNORMAL HIGH
INR: 1.6 — ABNORMAL HIGH
Prothrombin Time: 16.9 — ABNORMAL HIGH
Prothrombin Time: 31.6 — ABNORMAL HIGH

## 2011-03-09 LAB — CBC
HCT: 34 — ABNORMAL LOW
HCT: 34.6 — ABNORMAL LOW
HCT: 34.8 — ABNORMAL LOW
HCT: 35.4 — ABNORMAL LOW
Hemoglobin: 11.4 — ABNORMAL LOW
Hemoglobin: 11.5 — ABNORMAL LOW
Hemoglobin: 11.5 — ABNORMAL LOW
MCV: 82.6
Platelets: 186
Platelets: 188
Platelets: 195
RBC: 4.15
RDW: 14.2
WBC: 3.9 — ABNORMAL LOW
WBC: 4.3
WBC: 4.3
WBC: 4.6
WBC: 5.6

## 2011-03-09 LAB — BASIC METABOLIC PANEL
CO2: 27
Chloride: 104
GFR calc non Af Amer: 52 — ABNORMAL LOW
Glucose, Bld: 124 — ABNORMAL HIGH
Potassium: 3.8
Sodium: 139

## 2011-03-09 LAB — HEPARIN LEVEL (UNFRACTIONATED)
Heparin Unfractionated: 0.47
Heparin Unfractionated: 0.52
Heparin Unfractionated: 0.63

## 2011-03-09 LAB — OCCULT BLOOD X 1 CARD TO LAB, STOOL: Fecal Occult Bld: NEGATIVE

## 2011-06-21 HISTORY — PX: NM MYOVIEW LTD: HXRAD82

## 2012-01-17 ENCOUNTER — Other Ambulatory Visit: Payer: Self-pay | Admitting: Family Medicine

## 2012-01-17 DIAGNOSIS — Z1231 Encounter for screening mammogram for malignant neoplasm of breast: Secondary | ICD-10-CM

## 2012-01-25 ENCOUNTER — Ambulatory Visit
Admission: RE | Admit: 2012-01-25 | Discharge: 2012-01-25 | Disposition: A | Discharge status: Home / Self Care | Payer: Medicare Other | Source: Ambulatory Visit | Attending: Family Medicine | Admitting: Family Medicine

## 2012-01-25 DIAGNOSIS — Z1231 Encounter for screening mammogram for malignant neoplasm of breast: Secondary | ICD-10-CM

## 2012-01-29 ENCOUNTER — Other Ambulatory Visit: Payer: Self-pay | Admitting: Family Medicine

## 2012-01-29 DIAGNOSIS — R928 Other abnormal and inconclusive findings on diagnostic imaging of breast: Secondary | ICD-10-CM

## 2012-02-05 ENCOUNTER — Ambulatory Visit
Admission: RE | Admit: 2012-02-05 | Discharge: 2012-02-05 | Disposition: A | Discharge status: Home / Self Care | Payer: Medicare Other | Source: Ambulatory Visit | Attending: Family Medicine | Admitting: Family Medicine

## 2012-02-05 DIAGNOSIS — R928 Other abnormal and inconclusive findings on diagnostic imaging of breast: Secondary | ICD-10-CM

## 2012-02-07 ENCOUNTER — Other Ambulatory Visit: Payer: Medicare Other

## 2012-06-12 DIAGNOSIS — I499 Cardiac arrhythmia, unspecified: Secondary | ICD-10-CM

## 2012-06-12 HISTORY — DX: Cardiac arrhythmia, unspecified: I49.9

## 2012-08-28 ENCOUNTER — Ambulatory Visit: Payer: Self-pay | Admitting: Cardiovascular Disease

## 2012-08-28 DIAGNOSIS — I4891 Unspecified atrial fibrillation: Secondary | ICD-10-CM

## 2012-08-28 DIAGNOSIS — Z7901 Long term (current) use of anticoagulants: Secondary | ICD-10-CM | POA: Insufficient documentation

## 2012-11-13 ENCOUNTER — Other Ambulatory Visit: Payer: Self-pay | Admitting: Cardiovascular Disease

## 2012-11-13 NOTE — Telephone Encounter (Signed)
Pt called said the pharmacist told her to call here for her generic Warfarin to be called in--Call to [phone]!

## 2012-11-27 ENCOUNTER — Ambulatory Visit (INDEPENDENT_AMBULATORY_CARE_PROVIDER_SITE_OTHER): Payer: Medicare Other | Admitting: Pharmacist Clinician (PhC)/ Clinical Pharmacy Specialist

## 2012-11-27 ENCOUNTER — Ambulatory Visit: Payer: Medicare Other | Admitting: Pharmacist Clinician (PhC)/ Clinical Pharmacy Specialist

## 2012-11-27 VITALS — BP 152/90 | HR 76

## 2012-11-27 DIAGNOSIS — I4891 Unspecified atrial fibrillation: Secondary | ICD-10-CM

## 2012-11-27 DIAGNOSIS — Z7901 Long term (current) use of anticoagulants: Secondary | ICD-10-CM

## 2012-11-27 LAB — POCT INR: INR: 2.3

## 2012-12-25 ENCOUNTER — Ambulatory Visit: Payer: Medicare Other | Admitting: Pharmacist Clinician (PhC)/ Clinical Pharmacy Specialist

## 2013-01-01 ENCOUNTER — Ambulatory Visit (INDEPENDENT_AMBULATORY_CARE_PROVIDER_SITE_OTHER): Payer: Medicare Other | Admitting: Pharmacist Clinician (PhC)/ Clinical Pharmacy Specialist

## 2013-01-01 VITALS — BP 130/78 | HR 80

## 2013-01-01 DIAGNOSIS — Z7901 Long term (current) use of anticoagulants: Secondary | ICD-10-CM

## 2013-01-01 DIAGNOSIS — I4891 Unspecified atrial fibrillation: Secondary | ICD-10-CM

## 2013-02-12 ENCOUNTER — Ambulatory Visit: Payer: Medicare Other | Admitting: Pharmacist Clinician (PhC)/ Clinical Pharmacy Specialist

## 2013-02-12 ENCOUNTER — Ambulatory Visit (INDEPENDENT_AMBULATORY_CARE_PROVIDER_SITE_OTHER): Payer: Medicare Other | Admitting: Pharmacist Clinician (PhC)/ Clinical Pharmacy Specialist

## 2013-02-12 VITALS — BP 146/88 | HR 84

## 2013-02-12 DIAGNOSIS — Z7901 Long term (current) use of anticoagulants: Secondary | ICD-10-CM

## 2013-02-12 DIAGNOSIS — I4891 Unspecified atrial fibrillation: Secondary | ICD-10-CM

## 2013-02-18 ENCOUNTER — Other Ambulatory Visit: Payer: Self-pay

## 2013-02-18 DIAGNOSIS — Z1231 Encounter for screening mammogram for malignant neoplasm of breast: Secondary | ICD-10-CM

## 2013-03-07 ENCOUNTER — Ambulatory Visit: Payer: Medicare Other

## 2013-03-21 ENCOUNTER — Ambulatory Visit
Admission: RE | Admit: 2013-03-21 | Discharge: 2013-03-21 | Disposition: A | Discharge status: Home / Self Care | Payer: Medicare Other | Source: Ambulatory Visit

## 2013-03-21 DIAGNOSIS — Z1231 Encounter for screening mammogram for malignant neoplasm of breast: Secondary | ICD-10-CM

## 2013-03-25 ENCOUNTER — Ambulatory Visit: Payer: Medicare Other

## 2013-03-26 ENCOUNTER — Ambulatory Visit: Payer: Medicare Other | Admitting: Pharmacist Clinician (PhC)/ Clinical Pharmacy Specialist

## 2013-03-26 ENCOUNTER — Other Ambulatory Visit: Payer: Self-pay | Admitting: Family Medicine

## 2013-03-26 DIAGNOSIS — R928 Other abnormal and inconclusive findings on diagnostic imaging of breast: Secondary | ICD-10-CM

## 2013-04-02 ENCOUNTER — Ambulatory Visit (INDEPENDENT_AMBULATORY_CARE_PROVIDER_SITE_OTHER): Payer: Medicare Other | Admitting: Pharmacist Clinician (PhC)/ Clinical Pharmacy Specialist

## 2013-04-02 VITALS — BP 128/70 | HR 68

## 2013-04-02 DIAGNOSIS — Z7901 Long term (current) use of anticoagulants: Secondary | ICD-10-CM

## 2013-04-02 DIAGNOSIS — I4891 Unspecified atrial fibrillation: Secondary | ICD-10-CM

## 2013-04-18 ENCOUNTER — Ambulatory Visit
Admission: RE | Admit: 2013-04-18 | Discharge: 2013-04-18 | Disposition: A | Discharge status: Home / Self Care | Payer: Medicare Other | Source: Ambulatory Visit | Attending: Family Medicine | Admitting: Family Medicine

## 2013-04-18 DIAGNOSIS — R928 Other abnormal and inconclusive findings on diagnostic imaging of breast: Secondary | ICD-10-CM

## 2013-05-14 ENCOUNTER — Ambulatory Visit (INDEPENDENT_AMBULATORY_CARE_PROVIDER_SITE_OTHER): Payer: Medicare Other | Admitting: Pharmacist Clinician (PhC)/ Clinical Pharmacy Specialist

## 2013-05-14 VITALS — BP 136/90 | HR 92

## 2013-05-14 DIAGNOSIS — I4891 Unspecified atrial fibrillation: Secondary | ICD-10-CM

## 2013-05-14 DIAGNOSIS — Z7901 Long term (current) use of anticoagulants: Secondary | ICD-10-CM

## 2013-06-26 ENCOUNTER — Encounter: Payer: Self-pay | Admitting: Cardiovascular Disease

## 2013-06-26 ENCOUNTER — Ambulatory Visit (INDEPENDENT_AMBULATORY_CARE_PROVIDER_SITE_OTHER): Payer: Medicare Other | Admitting: Pharmacist Clinician (PhC)/ Clinical Pharmacy Specialist

## 2013-06-26 ENCOUNTER — Ambulatory Visit (INDEPENDENT_AMBULATORY_CARE_PROVIDER_SITE_OTHER): Payer: Medicare Other | Admitting: Cardiovascular Disease

## 2013-06-26 VITALS — BP 140/80 | HR 68 | Ht 67.0 in | Wt 130.6 lb

## 2013-06-26 DIAGNOSIS — I4891 Unspecified atrial fibrillation: Secondary | ICD-10-CM

## 2013-06-26 DIAGNOSIS — R350 Frequency of micturition: Secondary | ICD-10-CM

## 2013-06-26 DIAGNOSIS — E785 Hyperlipidemia, unspecified: Secondary | ICD-10-CM

## 2013-06-26 DIAGNOSIS — I10 Essential (primary) hypertension: Secondary | ICD-10-CM

## 2013-06-26 DIAGNOSIS — N390 Urinary tract infection, site not specified: Secondary | ICD-10-CM

## 2013-06-26 DIAGNOSIS — Z79899 Other long term (current) drug therapy: Secondary | ICD-10-CM

## 2013-06-26 DIAGNOSIS — Z7901 Long term (current) use of anticoagulants: Secondary | ICD-10-CM

## 2013-06-26 LAB — POCT INR: INR: 2.2

## 2013-06-26 MED ORDER — CIPROFLOXACIN HCL 250 MG PO TABS: 250.0000 mg | ORAL_TABLET | Freq: Two times a day (BID) | ORAL | Status: DC

## 2013-06-26 NOTE — Patient Instructions (Signed)
Your physician recommends that you return for lab work today.  Your physician has recommended you make the following change in your medication: Ciprofloxacin 250 mg twice daily x 5 days  Your physician recommends that you schedule a follow-up appointment in: 12 months with Dr.Croitoru

## 2013-06-27 LAB — BASIC METABOLIC PANEL WITH GFR
BUN: 17 mg/dL (ref 6–23)
CO2: 29 meq/L (ref 19–32)
Calcium: 9.1 mg/dL (ref 8.4–10.5)
Chloride: 100 meq/L (ref 96–112)
Creat: 0.87 mg/dL (ref 0.50–1.10)
Glucose, Bld: 76 mg/dL (ref 70–99)
Potassium: 4.2 meq/L (ref 3.5–5.3)
Sodium: 139 meq/L (ref 135–145)

## 2013-06-27 LAB — URINALYSIS
Bilirubin Urine: NEGATIVE
Glucose, UA: NEGATIVE mg/dL
Hgb urine dipstick: NEGATIVE
KETONES UR: NEGATIVE mg/dL
NITRITE: NEGATIVE
PH: 6 (ref 5.0–8.0)
Protein, ur: NEGATIVE mg/dL
SPECIFIC GRAVITY, URINE: 1.024 (ref 1.005–1.030)
UROBILINOGEN UA: 0.2 mg/dL (ref 0.0–1.0)

## 2013-06-28 LAB — URINE CULTURE
Colony Count: NO GROWTH
ORGANISM ID, BACTERIA: NO GROWTH

## 2013-06-30 ENCOUNTER — Encounter: Payer: Self-pay | Admitting: Cardiovascular Disease

## 2013-06-30 DIAGNOSIS — E7800 Pure hypercholesterolemia, unspecified: Secondary | ICD-10-CM | POA: Insufficient documentation

## 2013-06-30 DIAGNOSIS — I10 Essential (primary) hypertension: Secondary | ICD-10-CM

## 2013-06-30 DIAGNOSIS — R350 Frequency of micturition: Secondary | ICD-10-CM | POA: Insufficient documentation

## 2013-06-30 HISTORY — DX: Essential (primary) hypertension: I10

## 2013-06-30 NOTE — Assessment & Plan Note (Signed)
On statin therapy without known serious vascular problems. She is due to have labs in the near future with her primary care physician.

## 2013-06-30 NOTE — Assessment & Plan Note (Signed)
Her blood pressure is in the desirable range

## 2013-06-30 NOTE — Assessment & Plan Note (Signed)
Good rate control. Compliance with prothrombin time monitoring almost always within the desirable range. No history of embolic events and also no bleeding complications or falls. No changes in medicines.

## 2013-06-30 NOTE — Assessment & Plan Note (Signed)
We will check a urinalysis and a urine culture today since she has recently been on antibiotics. I gave her prescription for Cipr, buto asked that she not fill it until we get the results of her urinalysis.
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
## 2013-07-01 ENCOUNTER — Encounter: Payer: Self-pay | Admitting: Cardiovascular Disease

## 2013-08-19 ENCOUNTER — Other Ambulatory Visit: Payer: Self-pay | Admitting: Pharmacist Clinician (PhC)/ Clinical Pharmacy Specialist

## 2013-08-20 ENCOUNTER — Ambulatory Visit (INDEPENDENT_AMBULATORY_CARE_PROVIDER_SITE_OTHER): Payer: Medicare Other | Admitting: Pharmacist Clinician (PhC)/ Clinical Pharmacy Specialist

## 2013-08-20 DIAGNOSIS — I4891 Unspecified atrial fibrillation: Secondary | ICD-10-CM

## 2013-08-20 DIAGNOSIS — Z7901 Long term (current) use of anticoagulants: Secondary | ICD-10-CM

## 2013-08-20 LAB — POCT INR: INR: 2

## 2013-10-01 ENCOUNTER — Ambulatory Visit (INDEPENDENT_AMBULATORY_CARE_PROVIDER_SITE_OTHER): Payer: Medicare Other | Admitting: Pharmacist Clinician (PhC)/ Clinical Pharmacy Specialist

## 2013-10-01 VITALS — BP 144/82 | HR 80

## 2013-10-01 DIAGNOSIS — Z7901 Long term (current) use of anticoagulants: Secondary | ICD-10-CM

## 2013-10-01 DIAGNOSIS — I4891 Unspecified atrial fibrillation: Secondary | ICD-10-CM

## 2013-10-01 LAB — POCT INR: INR: 1.8

## 2013-11-12 ENCOUNTER — Ambulatory Visit (INDEPENDENT_AMBULATORY_CARE_PROVIDER_SITE_OTHER): Payer: Medicare Other | Admitting: Pharmacist Clinician (PhC)/ Clinical Pharmacy Specialist

## 2013-11-12 DIAGNOSIS — Z7901 Long term (current) use of anticoagulants: Secondary | ICD-10-CM

## 2013-11-12 DIAGNOSIS — I4891 Unspecified atrial fibrillation: Secondary | ICD-10-CM

## 2013-11-12 LAB — POCT INR: INR: 1.9

## 2013-12-10 ENCOUNTER — Ambulatory Visit (INDEPENDENT_AMBULATORY_CARE_PROVIDER_SITE_OTHER): Payer: Medicare Other | Admitting: Pharmacist Clinician (PhC)/ Clinical Pharmacy Specialist

## 2013-12-10 ENCOUNTER — Ambulatory Visit: Payer: Medicare Other | Admitting: Pharmacist Clinician (PhC)/ Clinical Pharmacy Specialist

## 2013-12-10 DIAGNOSIS — I4891 Unspecified atrial fibrillation: Secondary | ICD-10-CM

## 2013-12-10 DIAGNOSIS — Z7901 Long term (current) use of anticoagulants: Secondary | ICD-10-CM

## 2013-12-10 LAB — POCT INR: INR: 2.5

## 2014-01-21 ENCOUNTER — Ambulatory Visit (INDEPENDENT_AMBULATORY_CARE_PROVIDER_SITE_OTHER): Payer: Medicare Other | Admitting: Pharmacist Clinician (PhC)/ Clinical Pharmacy Specialist

## 2014-01-21 VITALS — BP 138/72

## 2014-01-21 DIAGNOSIS — I4891 Unspecified atrial fibrillation: Secondary | ICD-10-CM

## 2014-01-21 DIAGNOSIS — Z7901 Long term (current) use of anticoagulants: Secondary | ICD-10-CM

## 2014-01-21 LAB — POCT INR: INR: 2.8

## 2014-03-04 ENCOUNTER — Ambulatory Visit (INDEPENDENT_AMBULATORY_CARE_PROVIDER_SITE_OTHER): Payer: Medicare Other | Admitting: Pharmacist Clinician (PhC)/ Clinical Pharmacy Specialist

## 2014-03-04 DIAGNOSIS — Z7901 Long term (current) use of anticoagulants: Secondary | ICD-10-CM

## 2014-03-04 DIAGNOSIS — I4891 Unspecified atrial fibrillation: Secondary | ICD-10-CM

## 2014-03-04 LAB — POCT INR: INR: 2

## 2014-04-07 ENCOUNTER — Emergency Department (HOSPITAL_COMMUNITY): Payer: Medicare Other

## 2014-04-07 ENCOUNTER — Encounter (HOSPITAL_COMMUNITY): Payer: Self-pay | Admitting: Emergency Medicine

## 2014-04-07 ENCOUNTER — Emergency Department (HOSPITAL_COMMUNITY)
Admission: EM | Admit: 2014-04-07 | Discharge: 2014-04-07 | Disposition: A | Discharge status: Home / Self Care | Payer: Medicare Other | Attending: Emergency Medicine | Admitting: Emergency Medicine

## 2014-04-07 DIAGNOSIS — K5900 Constipation, unspecified: Secondary | ICD-10-CM | POA: Insufficient documentation

## 2014-04-07 DIAGNOSIS — I10 Essential (primary) hypertension: Secondary | ICD-10-CM | POA: Insufficient documentation

## 2014-04-07 DIAGNOSIS — Z79899 Other long term (current) drug therapy: Secondary | ICD-10-CM | POA: Diagnosis not present

## 2014-04-07 DIAGNOSIS — R110 Nausea: Secondary | ICD-10-CM | POA: Insufficient documentation

## 2014-04-07 DIAGNOSIS — R197 Diarrhea, unspecified: Secondary | ICD-10-CM | POA: Insufficient documentation

## 2014-04-07 DIAGNOSIS — R1032 Left lower quadrant pain: Secondary | ICD-10-CM | POA: Insufficient documentation

## 2014-04-07 DIAGNOSIS — Z7901 Long term (current) use of anticoagulants: Secondary | ICD-10-CM | POA: Insufficient documentation

## 2014-04-07 DIAGNOSIS — R109 Unspecified abdominal pain: Secondary | ICD-10-CM

## 2014-04-07 LAB — COMPREHENSIVE METABOLIC PANEL
ALT: 12 U/L (ref 0–35)
AST: 18 U/L (ref 0–37)
Albumin: 3.8 g/dL (ref 3.5–5.2)
Alkaline Phosphatase: 60 U/L (ref 39–117)
Anion gap: 14 (ref 5–15)
BILIRUBIN TOTAL: 0.7 mg/dL (ref 0.3–1.2)
BUN: 16 mg/dL (ref 6–23)
CHLORIDE: 101 meq/L (ref 96–112)
CO2: 27 mEq/L (ref 19–32)
Calcium: 9.4 mg/dL (ref 8.4–10.5)
Creatinine, Ser: 0.92 mg/dL (ref 0.50–1.10)
GFR calc Af Amer: 66 mL/min — ABNORMAL LOW (ref >90)
GFR calc non Af Amer: 57 mL/min — ABNORMAL LOW (ref >90)
Glucose, Bld: 94 mg/dL (ref 70–99)
POTASSIUM: 4.2 meq/L (ref 3.7–5.3)
Sodium: 142 mEq/L (ref 137–147)
Total Protein: 7.1 g/dL (ref 6.0–8.3)

## 2014-04-07 LAB — CBC WITH DIFFERENTIAL/PLATELET
Basophils Absolute: 0 10*3/uL (ref 0.0–0.1)
Basophils Relative: 1 % (ref 0–1)
EOS PCT: 3 % (ref 0–5)
Eosinophils Absolute: 0.1 10*3/uL (ref 0.0–0.7)
HEMATOCRIT: 40 % (ref 36.0–46.0)
HEMOGLOBIN: 13.1 g/dL (ref 12.0–15.0)
LYMPHS PCT: 44 % (ref 12–46)
Lymphs Abs: 1.8 10*3/uL (ref 0.7–4.0)
MCH: 28.4 pg (ref 26.0–34.0)
MCHC: 32.8 g/dL (ref 30.0–36.0)
MCV: 86.6 fL (ref 78.0–100.0)
MONO ABS: 0.4 10*3/uL (ref 0.1–1.0)
MONOS PCT: 10 % (ref 3–12)
Neutro Abs: 1.7 10*3/uL (ref 1.7–7.7)
Neutrophils Relative %: 42 % — ABNORMAL LOW (ref 43–77)
Platelets: 147 10*3/uL — ABNORMAL LOW (ref 150–400)
RBC: 4.62 MIL/uL (ref 3.87–5.11)
RDW: 13.5 % (ref 11.5–15.5)
WBC: 4.2 10*3/uL (ref 4.0–10.5)

## 2014-04-07 LAB — URINALYSIS, ROUTINE W REFLEX MICROSCOPIC
Bilirubin Urine: NEGATIVE
GLUCOSE, UA: NEGATIVE mg/dL
Hgb urine dipstick: NEGATIVE
KETONES UR: 15 mg/dL — AB
LEUKOCYTES UA: NEGATIVE
NITRITE: NEGATIVE
PH: 7.5 (ref 5.0–8.0)
Protein, ur: NEGATIVE mg/dL
SPECIFIC GRAVITY, URINE: 1.008 (ref 1.005–1.030)
Urobilinogen, UA: 0.2 mg/dL (ref 0.0–1.0)

## 2014-04-07 LAB — LIPASE, BLOOD: Lipase: 32 U/L (ref 11–59)

## 2014-04-07 LAB — I-STAT TROPONIN, ED: Troponin i, poc: 0 ng/mL (ref 0.00–0.08)

## 2014-04-07 MED ORDER — IOHEXOL 300 MG/ML  SOLN
25.0000 mL | Freq: Once | INTRAMUSCULAR | Status: AC | PRN
  Administered 2014-04-07: 25 mL via ORAL

## 2014-04-07 MED ORDER — MORPHINE SULFATE 2 MG/ML IJ SOLN
2.0000 mg | INTRAMUSCULAR | Status: DC | PRN
  Filled 2014-04-07: qty 1

## 2014-04-07 MED ORDER — IOHEXOL 300 MG/ML  SOLN
80.0000 mL | Freq: Once | INTRAMUSCULAR | Status: AC | PRN
  Administered 2014-04-07: 80 mL via INTRAVENOUS

## 2014-04-07 MED ORDER — ONDANSETRON HCL 4 MG/2ML IJ SOLN
4.0000 mg | Freq: Once | INTRAMUSCULAR | Status: AC
  Administered 2014-04-07: 4 mg via INTRAVENOUS
  Filled 2014-04-07: qty 2

## 2014-04-07 MED ORDER — FENTANYL CITRATE 0.05 MG/ML IJ SOLN
25.0000 ug | INTRAMUSCULAR | Status: DC | PRN
  Administered 2014-04-07: 25 ug via INTRAVENOUS
  Filled 2014-04-07: qty 2

## 2014-04-07 NOTE — ED Notes (Signed)
C/o pain to llq for 3 weeks with nausea and pain with bowel movement and uriination

## 2014-04-07 NOTE — ED Notes (Signed)
I gave the patient a pair of large yellow socks.

## 2014-04-07 NOTE — ED Provider Notes (Signed)
CSN: [id number]     Arrival date & time 04/07/14  1001 History   First MD Initiated Contact with Patient 04/07/14 1004     Chief Complaint  Patient presents with  . Abdominal Pain      HPI  Patient presents with abdominal pain. Intermittent abdominal pain for the last 3 weeks. Triage note states that his left lower quadrant. It is intermittent and in different locations and somewhat random. Occasional dysuria not today. Was constipated. The Colace for several days. Had diarrhea. Stopped her Colace. Occasionally feels like "something is swollen in there".  Past Medical History  Diagnosis Date  . Essential hypertension 06/30/2013   History reviewed. No pertinent past surgical history. History reviewed. No pertinent family history. History  Substance Use Topics  . Smoking status: Never Smoker   . Smokeless tobacco: Not on file  . Alcohol Use: Not on file   OB History   Grav Para Term Preterm Abortions TAB SAB Ect Mult Living                 Review of Systems  Constitutional: Negative for fever, chills, diaphoresis, appetite change and fatigue.  HENT: Negative for mouth sores, sore throat and trouble swallowing.   Eyes: Negative for visual disturbance.  Respiratory: Negative for cough, chest tightness, shortness of breath and wheezing.   Cardiovascular: Negative for chest pain.  Gastrointestinal: Positive for nausea, abdominal pain, diarrhea, constipation and abdominal distention. Negative for vomiting.  Endocrine: Negative for polydipsia, polyphagia and polyuria.  Genitourinary: Negative for dysuria, frequency and hematuria.  Musculoskeletal: Negative for gait problem.  Skin: Negative for color change, pallor and rash.  Neurological: Negative for dizziness, syncope, light-headedness and headaches.  Hematological: Does not bruise/bleed easily.  Psychiatric/Behavioral: Negative for behavioral problems and confusion.      Allergies  Codeine and Morphine and related  Home  Medications   Prior to Admission medications   Medication Sig Start Date End Date Taking? Authorizing Provider  calcium-vitamin D (OSCAL WITH D) 500-200 MG-UNIT per tablet Take 1 tablet by mouth daily.   Yes Historical Provider, MD  Carboxymethylcellul-Glycerin (OPTIVE) 0.5-0.9 % SOLN Place 1 drop into both eyes 2 (two) times daily.    Yes Historical Provider, MD  carvedilol (COREG) 12.5 MG tablet Take 12.5 mg by mouth 2 (two) times daily with a meal.   Yes Historical Provider, MD  cholecalciferol (VITAMIN D) 1000 UNITS tablet Take 1,000 Units by mouth daily.   Yes Historical Provider, MD  diazepam (VALIUM) 5 MG tablet Take 5 mg by mouth 2 (two) times daily.   Yes Historical Provider, MD  meclizine (ANTIVERT) 25 MG tablet Take 25 mg by mouth 3 (three) times daily as needed for dizziness.   Yes Historical Provider, MD  pravastatin (PRAVACHOL) 40 MG tablet Take 40 mg by mouth daily.   Yes Historical Provider, MD  senna-docusate (SENOKOT-S) 8.6-50 MG per tablet Take 1 tablet by mouth daily.   Yes Historical Provider, MD  warfarin (COUMADIN) 5 MG tablet Take 2.5-5 mg by mouth daily. Take 5 mg by mouth on Sunday, Tuesday and Thursday. Take 2.5 mg by mouth on all other nights.   Yes Historical Provider, MD   BP 141/89  Pulse 96  Temp(Src) 98.4 F (36.9 C) (Oral)  Resp 22  Ht 5\' 6"  (1.676 m)  Wt 140 lb (63.504 kg)  BMI 22.61 kg/m2  SpO2 98% Physical Exam  Constitutional: She is oriented to person, place, and time. She appears well-developed and well-nourished. No  distress.  HENT:  Head: Normocephalic.  Resting perioral tremor. Common for the patient per the son's report  Eyes: Conjunctivae are normal. Pupils are equal, round, and reactive to light. No scleral icterus.  Neck: Normal range of motion. Neck supple. No thyromegaly present.  Cardiovascular: Normal rate and regular rhythm.  Exam reveals no gallop and no friction rub.   No murmur heard. Pulmonary/Chest: Effort normal and breath  sounds normal. No respiratory distress. She has no wheezes. She has no rales.  Abdominal: Soft. Bowel sounds are normal. She exhibits no distension. There is no tenderness. There is no rebound.  No palpable mass, hernia, or pulsations. Benign abdomen without localized tenderness, or peritoneal irritation. Perirectal exam normal. No hernia. No impaction.  Musculoskeletal: Normal range of motion.  Neurological: She is alert and oriented to person, place, and time.  Skin: Skin is warm and dry. No rash noted.  Psychiatric: She has a normal mood and affect. Her behavior is normal.    ED Course  Procedures (including critical care time) Labs Review Labs Reviewed  COMPREHENSIVE METABOLIC PANEL - Abnormal; Notable for the following:    GFR calc non Af Amer 57 (*)    GFR calc Af Amer 66 (*)    All other components within normal limits  CBC WITH DIFFERENTIAL - Abnormal; Notable for the following:    Platelets 147 (*)    Neutrophils Relative % 42 (*)    All other components within normal limits  URINALYSIS, ROUTINE W REFLEX MICROSCOPIC - Abnormal; Notable for the following:    Ketones, ur 15 (*)    All other components within normal limits  LIPASE, BLOOD  I-STAT TROPOININ, ED    Imaging Review Ct Abdomen Pelvis W Contrast  04/07/2014   CLINICAL DATA:  Acute onset of left lower quadrant abdominal pain.  EXAM: CT ABDOMEN AND PELVIS WITH CONTRAST  TECHNIQUE: Multidetector CT imaging of the abdomen and pelvis was performed using the standard protocol following bolus administration of intravenous contrast.  CONTRAST:  80mL OMNIPAQUE IOHEXOL 300 MG/ML  SOLN  COMPARISON:  CT scan dated 08/14/2009  FINDINGS: The liver biliary tree are normal.  There is a 10.5 mm lucent lesion in the tail of the pancreas best seen on image 22 of series 2. There is also a 9 mm low-density lesion in the head of the pancreas on image 28 of series 2. There is calcification associated with a pancreatic head lesion. These could  represent tiny pancreatic pseudocysts or IPMN's.(intraductal papillary mucinous tumors ). There are essentially unchanged since 2011 and felt to be benign.  The spleen, adrenal glands, and kidneys are normal. There are multiple diverticula in the distal descending and proximal sigmoid portions of the colon. The bowel is otherwise normal including the terminal ileum and appendix. No free air or free fluid. Uterus has been removed. I see what appear to be small atrophic ovaries.  Old compression fracture of L2, stable. Chronic cardiomegaly. Slight scarring at the lung bases.  IMPRESSION: 1. No acute abnormality of the abdomen or pelvis. 2. Diverticulosis of the distal colon. 3. Two stable small cystic lesions in the pancreas as described above. These are felt to be benign.   Electronically Signed   By: Geanie CooleyJim  Maxwell M.D.   On: 04/07/2014 13:26     EKG Interpretation None      MDM   Final diagnoses:  AP (abdominal pain)    Reassuring studies. Normal exam. No palpable hernia. No mass on CT. No aneurysm.  States that she feels better. I think she is appropriate for outpatient follow-up with her primary care physician. This may been an episode of constipation. She gets intermittent diarrhea with Colace. Vascular to increase her fiber intake.    Rolland Porter, MD 04/07/14 [phone]

## 2014-04-07 NOTE — ED Notes (Signed)
Pt to ct 

## 2014-04-07 NOTE — Discharge Instructions (Signed)
Take fiber supplement daily if you're having episodes of constipation. This may work better for you than your Colace. Tylenol as needed for any pain. Recheck with your primary care physician. Return to the emergency room with worsening pain, fever, bloody stools, or other new symptoms.  Abdominal Pain Many things can cause abdominal pain. Usually, abdominal pain is not caused by a disease and will improve without treatment. It can often be observed and treated at home. Your health care provider will do a physical exam and possibly order blood tests and X-rays to help determine the seriousness of your pain. However, in many cases, more time must pass before a clear cause of the pain can be found. Before that point, your health care provider may not know if you need more testing or further treatment. HOME CARE INSTRUCTIONS  Monitor your abdominal pain for any changes. The following actions may help to alleviate any discomfort you are experiencing:  Only take over-the-counter or prescription medicines as directed by your health care provider.  Do not take laxatives unless directed to do so by your health care provider.  Try a clear liquid diet (broth, tea, or water) as directed by your health care provider. Slowly move to a bland diet as tolerated. SEEK MEDICAL CARE IF:  You have unexplained abdominal pain.  You have abdominal pain associated with nausea or diarrhea.  You have pain when you urinate or have a bowel movement.  You experience abdominal pain that wakes you in the night.  You have abdominal pain that is worsened or improved by eating food.  You have abdominal pain that is worsened with eating fatty foods.  You have a fever. SEEK IMMEDIATE MEDICAL CARE IF:   Your pain does not go away within 2 hours.  You keep throwing up (vomiting).  Your pain is felt only in portions of the abdomen, such as the right side or the left lower portion of the abdomen.  You pass bloody or  black tarry stools. MAKE SURE YOU:  Understand these instructions.   Will watch your condition.   Will get help right away if you are not doing well or get worse.  Document Released: 03/08/2005 Document Revised: 06/03/2013 Document Reviewed: 02/05/2013 [facility] Patient Information 2015 [employer], [place]. This information is not intended to replace advice given to you by your health care provider. Make sure you discuss any questions you have with your health care provider.

## 2014-04-07 NOTE — ED Notes (Signed)
Pt drank ct po contrast

## 2014-04-15 ENCOUNTER — Ambulatory Visit (INDEPENDENT_AMBULATORY_CARE_PROVIDER_SITE_OTHER): Payer: Medicare Other | Admitting: Pharmacist Clinician (PhC)/ Clinical Pharmacy Specialist

## 2014-04-15 DIAGNOSIS — Z7901 Long term (current) use of anticoagulants: Secondary | ICD-10-CM

## 2014-04-15 DIAGNOSIS — I4891 Unspecified atrial fibrillation: Secondary | ICD-10-CM

## 2014-04-15 LAB — POCT INR: INR: 2

## 2014-05-11 ENCOUNTER — Telehealth: Payer: Self-pay | Admitting: Cardiovascular Disease

## 2014-05-11 NOTE — Telephone Encounter (Signed)
Deferred to [facility] to refill

## 2014-05-11 NOTE — Telephone Encounter (Signed)
Pt need a prescription for Warfarin. Please call to [facility]-[phone].Please call this in today if possible.

## 2014-05-12 MED ORDER — WARFARIN SODIUM 5 MG PO TABS: ORAL_TABLET | ORAL | Status: DC

## 2014-05-27 ENCOUNTER — Ambulatory Visit (INDEPENDENT_AMBULATORY_CARE_PROVIDER_SITE_OTHER): Payer: Medicare Other | Admitting: Pharmacist Clinician (PhC)/ Clinical Pharmacy Specialist

## 2014-05-27 DIAGNOSIS — Z7901 Long term (current) use of anticoagulants: Secondary | ICD-10-CM

## 2014-05-27 DIAGNOSIS — I4891 Unspecified atrial fibrillation: Secondary | ICD-10-CM

## 2014-05-27 LAB — POCT INR: INR: 2.8

## 2014-06-18 ENCOUNTER — Telehealth: Payer: Self-pay | Admitting: Cardiovascular Disease

## 2014-06-18 ENCOUNTER — Other Ambulatory Visit: Payer: Self-pay | Admitting: Cardiovascular Disease

## 2014-06-18 MED ORDER — WARFARIN SODIUM 5 MG PO TABS: ORAL_TABLET | ORAL | Status: DC

## 2014-06-18 NOTE — Telephone Encounter (Signed)
Rebecca LagerYolanda called in stating that they are changing manufacturers for the Warfarin and would like to get and ok from the doctor or nurse before refilling the medication. Please call

## 2014-06-18 NOTE — Telephone Encounter (Signed)
Rx(s) sent to pharmacy electronically.  

## 2014-06-18 NOTE — Telephone Encounter (Signed)
LMOM at [facility] - ok to switch manufacturer on warfarin

## 2014-06-18 NOTE — Telephone Encounter (Signed)
Pt need a new prescription for Warfarin #90.Please call to [facility]-[phone].

## 2014-06-18 NOTE — Telephone Encounter (Signed)
wil forward to [facility]

## 2014-07-08 ENCOUNTER — Ambulatory Visit (INDEPENDENT_AMBULATORY_CARE_PROVIDER_SITE_OTHER): Payer: BLUE CROSS/BLUE SHIELD | Admitting: Pharmacist Clinician (PhC)/ Clinical Pharmacy Specialist

## 2014-07-08 DIAGNOSIS — I4891 Unspecified atrial fibrillation: Secondary | ICD-10-CM

## 2014-07-08 DIAGNOSIS — Z7901 Long term (current) use of anticoagulants: Secondary | ICD-10-CM

## 2014-07-08 LAB — POCT INR: INR: 2.5

## 2014-08-03 ENCOUNTER — Encounter: Payer: Self-pay | Admitting: *Deleted

## 2014-08-09 ENCOUNTER — Emergency Department (HOSPITAL_COMMUNITY): Payer: Medicare Other

## 2014-08-09 ENCOUNTER — Emergency Department (HOSPITAL_COMMUNITY)
Admission: EM | Admit: 2014-08-09 | Discharge: 2014-08-09 | Disposition: A | Discharge status: Home / Self Care | Payer: Medicare Other | Attending: Emergency Medicine | Admitting: Emergency Medicine

## 2014-08-09 ENCOUNTER — Encounter (HOSPITAL_COMMUNITY): Payer: Self-pay | Admitting: Emergency Medicine

## 2014-08-09 DIAGNOSIS — Y939 Activity, unspecified: Secondary | ICD-10-CM | POA: Insufficient documentation

## 2014-08-09 DIAGNOSIS — Z79899 Other long term (current) drug therapy: Secondary | ICD-10-CM | POA: Diagnosis not present

## 2014-08-09 DIAGNOSIS — W19XXXA Unspecified fall, initial encounter: Secondary | ICD-10-CM

## 2014-08-09 DIAGNOSIS — Y929 Unspecified place or not applicable: Secondary | ICD-10-CM | POA: Diagnosis not present

## 2014-08-09 DIAGNOSIS — I4891 Unspecified atrial fibrillation: Secondary | ICD-10-CM | POA: Diagnosis not present

## 2014-08-09 DIAGNOSIS — S0181XA Laceration without foreign body of other part of head, initial encounter: Secondary | ICD-10-CM

## 2014-08-09 DIAGNOSIS — Z8719 Personal history of other diseases of the digestive system: Secondary | ICD-10-CM | POA: Diagnosis not present

## 2014-08-09 DIAGNOSIS — Y999 Unspecified external cause status: Secondary | ICD-10-CM | POA: Insufficient documentation

## 2014-08-09 DIAGNOSIS — W228XXA Striking against or struck by other objects, initial encounter: Secondary | ICD-10-CM | POA: Diagnosis not present

## 2014-08-09 DIAGNOSIS — S01112A Laceration without foreign body of left eyelid and periocular area, initial encounter: Secondary | ICD-10-CM | POA: Diagnosis not present

## 2014-08-09 DIAGNOSIS — I10 Essential (primary) hypertension: Secondary | ICD-10-CM | POA: Diagnosis not present

## 2014-08-09 DIAGNOSIS — E785 Hyperlipidemia, unspecified: Secondary | ICD-10-CM | POA: Insufficient documentation

## 2014-08-09 DIAGNOSIS — R42 Dizziness and giddiness: Secondary | ICD-10-CM | POA: Insufficient documentation

## 2014-08-09 DIAGNOSIS — Z7901 Long term (current) use of anticoagulants: Secondary | ICD-10-CM | POA: Diagnosis not present

## 2014-08-09 HISTORY — DX: Unspecified atrial fibrillation: I48.91

## 2014-08-09 MED ORDER — ACETAMINOPHEN 325 MG PO TABS
650.0000 mg | ORAL_TABLET | Freq: Once | ORAL | Status: AC
Start: 2014-08-09 — End: 2014-08-09
  Administered 2014-08-09: 650 mg via ORAL
  Filled 2014-08-09: qty 2

## 2014-08-09 MED ORDER — TRAMADOL HCL 50 MG PO TABS: 50.0000 mg | ORAL_TABLET | Freq: Four times a day (QID) | ORAL | Status: DC | PRN

## 2014-08-09 NOTE — Discharge Instructions (Signed)
Return here as needed. Follow up with your doctor for a recheck. °

## 2014-08-09 NOTE — ED Notes (Signed)
Fell at 0300; got up to go to bathroom, felt dizzy and "fainted;" Does not know if she lost consciousness.  Hit head on nightstand. Laceration to left eye, bruising to left head, pain in bilateral shoulder and upper back. Alert and oriented at her baseline for mental status.

## 2014-08-09 NOTE — ED Notes (Signed)
Patient returned from Radiology. 

## 2014-08-09 NOTE — ED Provider Notes (Signed)
CSN: [id number]     Arrival date & time 08/09/14  0930 History   First MD Initiated Contact with Patient 08/09/14 0945     Chief Complaint  Patient presents with  . Fall     (Consider location/radiation/quality/duration/timing/severity/associated sxs/prior Treatment) HPI Patient presents to the emergency department following a fall that occurred earlier this morning.  The patient states that she went to the bathroom around 3 AM and felt dizzy and states that this happens to her quite frequently when she gets up too fast.  The patient states that she is unsure if she lost consciousness.  Patient states she did hit her headache is the bed and nightstand area.  The patient's complaining of bilateral shoulder and upper back pain along with pain to the left eyebrow region.  Patient denies chest pain, shortness of breath, blurred vision, weakness, numbness, lightheadedness, fever, cough, abdominal pain or anorexia.  The patient states that she is on Coumadin for atrial fibrillation Past Medical History  Diagnosis Date  . Essential hypertension 06/30/2013  . Hyperlipidemia     on statin therapy  . Arrhythmia 2014    chronic/permanent atrial fibrillation -rate control with carvedilol and anticoag wth warfarin  . History of diverticulitis   . Chronic diarrhea   . A-fib    Past Surgical History  Procedure Laterality Date  . Transesophageal echocardiogram  12/10/2007    normal left ventricle ; normal aortic valve there was trivial  valvular regurg; left atium markedly dilated no evidence for atrial thrombus,; mild to moderate tricuspid valvular regurg. , right atrium markedly dilated  . Doppler echocardiography  10/17/2007    EF=>55%; -LA -moderated diltaed; RA-moderately; borderline mitral valve prolapse of AMVL- moderate mitral regurg.; moderatetricuspid regurg.--rgt ventricular systolic pressure elevated 30-40 mmHg  . Nm myoview ltd  06/21/2011    EF 78%  LV systolic function normal - NORMAL  STUDY  . Abdominal  aorta doppler  10/17/2007    normal taper--normal duplex   Family History  Problem Relation Age of Onset  . Heart disease Mother   . Heart attack Father   . Diabetes Sister   . Cancer Brother    History  Substance Use Topics  . Smoking status: Never Smoker   . Smokeless tobacco: Not on file  . Alcohol Use: No   OB History    No data available     Review of Systems  All other systems negative except as documented in the HPI. All pertinent positives and negatives as reviewed in the HPI.  Allergies  Metoprolol; Codeine; and Morphine and related  Home Medications   Prior to Admission medications   Medication Sig Start Date End Date Taking? Authorizing Provider  calcium-vitamin D (OSCAL WITH D) 500-200 MG-UNIT per tablet Take 1 tablet by mouth daily.    Historical Provider, MD  Carboxymethylcellul-Glycerin (OPTIVE) 0.5-0.9 % SOLN Place 1 drop into both eyes 2 (two) times daily.     Historical Provider, MD  carvedilol (COREG) 12.5 MG tablet Take 12.5 mg by mouth 2 (two) times daily with a meal.    Historical Provider, MD  cholecalciferol (VITAMIN D) 1000 UNITS tablet Take 1,000 Units by mouth daily.    Historical Provider, MD  diazepam (VALIUM) 5 MG tablet Take 5 mg by mouth 2 (two) times daily.    Historical Provider, MD  meclizine (ANTIVERT) 25 MG tablet Take 25 mg by mouth 3 (three) times daily as needed for dizziness.    Historical Provider, MD  pravastatin (PRAVACHOL)  40 MG tablet Take 40 mg by mouth daily.    Historical Provider, MD  warfarin (COUMADIN) 5 MG tablet Take 1 tablet by mouth daily as directed by coumadin clinic. <PLEASE MAKE APPOINTMENT WITH DR. C FOR REFILLS> 06/18/14   Mihai Croitoru, MD   BP 146/94 mmHg  Pulse 92  Temp(Src) 98.1 F (36.7 C) (Oral)  Resp 27  SpO2 98% Physical Exam  Constitutional: She is oriented to person, place, and time. She appears well-developed and well-nourished. No distress.  HENT:  Head: Normocephalic.     Mouth/Throat: Oropharynx is clear and moist.  Eyes: EOM are normal. Pupils are equal, round, and reactive to light.  Neck: Normal range of motion. Neck supple.  Cardiovascular: Normal rate, regular rhythm and normal heart sounds.  Exam reveals no gallop and no friction rub.   No murmur heard. Pulmonary/Chest: Effort normal and breath sounds normal. No respiratory distress.  Abdominal: Soft. Bowel sounds are normal. She exhibits no distension. There is no tenderness.  Musculoskeletal: She exhibits no edema.  Neurological: She is alert and oriented to person, place, and time. She exhibits normal muscle tone. Coordination normal.  Skin: Skin is warm and dry. No rash noted. No erythema.  Nursing note and vitals reviewed.   ED Course  Procedures (including critical care time) Labs Review Labs Reviewed - No data to display  Imaging Review Dg Shoulder Right  08/09/2014   CLINICAL DATA:  Fall  EXAM: RIGHT SHOULDER - 2+ VIEW  COMPARISON:  None.  FINDINGS: No fracture or dislocation is seen.  Degenerative changes of the acromioclavicular joint.  The visualized soft tissues are unremarkable.  Visualized right lung is clear.  IMPRESSION: No fracture or dislocation is seen.   Electronically Signed   By: Charline BillsSriyesh  Krishnan M.D.   On: 08/09/2014 10:56   Ct Head Wo Contrast  08/09/2014   CLINICAL DATA:  Dizziness, fall, left facial injury, laceration to left eyebrow, bruising to the orbit, hematoma behind the left ear, headache, neck pain  EXAM: CT HEAD WITHOUT CONTRAST  CT MAXILLOFACIAL WITHOUT CONTRAST  CT CERVICAL SPINE WITHOUT CONTRAST  TECHNIQUE: Multidetector CT imaging of the head, cervical spine, and maxillofacial structures were performed using the standard protocol without intravenous contrast. Multiplanar CT image reconstructions of the cervical spine and maxillofacial structures were also generated.  COMPARISON:  CT head dated 11/06/2007  FINDINGS: CT HEAD FINDINGS  No evidence of parenchymal  hemorrhage or extra-axial fluid collection. No mass lesion, mass effect, or midline shift.  No CT evidence of acute infarction.  Subcortical white matter and periventricular small vessel ischemic changes. Intracranial atherosclerosis.  Cerebral volume is within normal limits.  No ventriculomegaly.  The visualized paranasal sinuses are essentially clear. The mastoid air cells are unopacified.  No evidence of calvarial fracture.  CT MAXILLOFACIAL FINDINGS  No evidence of maxillofacial fracture.  The visualized paranasal sinuses are essentially clear. The mastoid air cells are unopacified.  The mandible is intact. The bilateral mandibular condyles are well-seated in the TMJs.  Mild soft tissue swelling overlying the lateral left orbit/zygoma (series 301/ image 16) and left facial bone (series 31/image 7).  The underlying globe/retroconal soft tissues are within normal limits.  CT CERVICAL SPINE FINDINGS  Exaggerated cervical lordosis.  No evidence of fracture or dislocation. Vertebral body heights are maintained. Dens appears intact.  No prevertebral soft tissue swelling.  Mild degenerative changes of the lower cervical spine.  Visualized left thyroid is enlarged/heterogeneous.  Visualized lung apices are notable for biapical pleural-parenchymal  scarring, right greater than left.  IMPRESSION: Mild soft tissue swelling overlying the left facial bone and left lateral orbit/zygoma.  No evidence of calvarial fracture. No evidence of maxillofacial fracture.  No evidence of acute intracranial abnormality. Small vessel ischemic changes with intracranial atherosclerosis.  No evidence of traumatic injury to the cervical spine. Mild degenerative changes.   Electronically Signed   By: Charline Bills M.D.   On: 08/09/2014 11:35   Ct Cervical Spine Wo Contrast  08/09/2014   CLINICAL DATA:  Dizziness, fall, left facial injury, laceration to left eyebrow, bruising to the orbit, hematoma behind the left ear, headache, neck pain   EXAM: CT HEAD WITHOUT CONTRAST  CT MAXILLOFACIAL WITHOUT CONTRAST  CT CERVICAL SPINE WITHOUT CONTRAST  TECHNIQUE: Multidetector CT imaging of the head, cervical spine, and maxillofacial structures were performed using the standard protocol without intravenous contrast. Multiplanar CT image reconstructions of the cervical spine and maxillofacial structures were also generated.  COMPARISON:  CT head dated 11/06/2007  FINDINGS: CT HEAD FINDINGS  No evidence of parenchymal hemorrhage or extra-axial fluid collection. No mass lesion, mass effect, or midline shift.  No CT evidence of acute infarction.  Subcortical white matter and periventricular small vessel ischemic changes. Intracranial atherosclerosis.  Cerebral volume is within normal limits.  No ventriculomegaly.  The visualized paranasal sinuses are essentially clear. The mastoid air cells are unopacified.  No evidence of calvarial fracture.  CT MAXILLOFACIAL FINDINGS  No evidence of maxillofacial fracture.  The visualized paranasal sinuses are essentially clear. The mastoid air cells are unopacified.  The mandible is intact. The bilateral mandibular condyles are well-seated in the TMJs.  Mild soft tissue swelling overlying the lateral left orbit/zygoma (series 301/ image 16) and left facial bone (series 31/image 7).  The underlying globe/retroconal soft tissues are within normal limits.  CT CERVICAL SPINE FINDINGS  Exaggerated cervical lordosis.  No evidence of fracture or dislocation. Vertebral body heights are maintained. Dens appears intact.  No prevertebral soft tissue swelling.  Mild degenerative changes of the lower cervical spine.  Visualized left thyroid is enlarged/heterogeneous.  Visualized lung apices are notable for biapical pleural-parenchymal scarring, right greater than left.  IMPRESSION: Mild soft tissue swelling overlying the left facial bone and left lateral orbit/zygoma.  No evidence of calvarial fracture. No evidence of maxillofacial fracture.   No evidence of acute intracranial abnormality. Small vessel ischemic changes with intracranial atherosclerosis.  No evidence of traumatic injury to the cervical spine. Mild degenerative changes.   Electronically Signed   By: Charline Bills M.D.   On: 08/09/2014 11:35   Dg Shoulder Left  08/09/2014   CLINICAL DATA:  81 year old female status post fall last night with bilateral shoulder and left hip pain. Patient also experiencing vertigo.  EXAM: LEFT SHOULDER - 2+ VIEW  COMPARISON:  None.  FINDINGS: There is no evidence of fracture or dislocation. There is no evidence of arthropathy or other focal bone abnormality. Soft tissues are unremarkable.  IMPRESSION: Negative.   Electronically Signed   By: Malachy Moan M.D.   On: 08/09/2014 10:58   Dg Hip Unilat With Pelvis 2-3 Views Left  08/09/2014   CLINICAL DATA:  Fall, left hip pain  EXAM: LEFT HIP (WITH PELVIS) 2-3 VIEWS  COMPARISON:  None.  FINDINGS: No fracture or dislocation is seen.  Mild degenerative changes of the bilateral hips.  Visualized bony pelvis appears intact.  IMPRESSION: No fracture or dislocation is seen.   Electronically Signed   By: Roselie Awkward.D.  On: 08/09/2014 10:56   Ct Maxillofacial Wo Cm  08/09/2014   CLINICAL DATA:  Dizziness, fall, left facial injury, laceration to left eyebrow, bruising to the orbit, hematoma behind the left ear, headache, neck pain  EXAM: CT HEAD WITHOUT CONTRAST  CT MAXILLOFACIAL WITHOUT CONTRAST  CT CERVICAL SPINE WITHOUT CONTRAST  TECHNIQUE: Multidetector CT imaging of the head, cervical spine, and maxillofacial structures were performed using the standard protocol without intravenous contrast. Multiplanar CT image reconstructions of the cervical spine and maxillofacial structures were also generated.  COMPARISON:  CT head dated 11/06/2007  FINDINGS: CT HEAD FINDINGS  No evidence of parenchymal hemorrhage or extra-axial fluid collection. No mass lesion, mass effect, or midline shift.  No CT  evidence of acute infarction.  Subcortical white matter and periventricular small vessel ischemic changes. Intracranial atherosclerosis.  Cerebral volume is within normal limits.  No ventriculomegaly.  The visualized paranasal sinuses are essentially clear. The mastoid air cells are unopacified.  No evidence of calvarial fracture.  CT MAXILLOFACIAL FINDINGS  No evidence of maxillofacial fracture.  The visualized paranasal sinuses are essentially clear. The mastoid air cells are unopacified.  The mandible is intact. The bilateral mandibular condyles are well-seated in the TMJs.  Mild soft tissue swelling overlying the lateral left orbit/zygoma (series 301/ image 16) and left facial bone (series 31/image 7).  The underlying globe/retroconal soft tissues are within normal limits.  CT CERVICAL SPINE FINDINGS  Exaggerated cervical lordosis.  No evidence of fracture or dislocation. Vertebral body heights are maintained. Dens appears intact.  No prevertebral soft tissue swelling.  Mild degenerative changes of the lower cervical spine.  Visualized left thyroid is enlarged/heterogeneous.  Visualized lung apices are notable for biapical pleural-parenchymal scarring, right greater than left.  IMPRESSION: Mild soft tissue swelling overlying the left facial bone and left lateral orbit/zygoma.  No evidence of calvarial fracture. No evidence of maxillofacial fracture.  No evidence of acute intracranial abnormality. Small vessel ischemic changes with intracranial atherosclerosis.  No evidence of traumatic injury to the cervical spine. Mild degenerative changes.   Electronically Signed   By: Charline Bills M.D.   On: 08/09/2014 11:35     EKG Interpretation   Date/Time:  Sunday August 09 2014 09:50:43 EST Ventricular Rate:  113 PR Interval:  132 QRS Duration: 126 QT Interval:  376 QTC Calculation: 516 R Axis:   -17 Text Interpretation:  Sinus tachycardia Multiform ventricular premature  complexes IVCD, consider  atypical RBBB Probable anterior infarct, age  indeterminate Poor data quality in current ECG precludes serial comparison  Confirmed by [facility]  MD, Nicholos Johns [phone]) on 08/09/2014 10:00:27 AM     LACERATION REPAIR Performed by: Carlyle Dolly Authorized by: Carlyle Dolly Consent: Verbal consent obtained. Risks and benefits: risks, benefits and alternatives were discussed Consent given by: patient Patient identity confirmed: provided demographic data Prepped and Draped in normal sterile fashion Wound explored  Laceration Location: Left lateral eyebrow region  Laceration Length: 2 cm  No Foreign Bodies seen or palpated  Anesthesia: local infiltration  Local anesthetic: N/A Anesthetic total: N/A Irrigation method: syringe Amount of cleaning: standard  Skin closure: Dermabond   Number of sutures:see above  Technique: Dermabond   Patient tolerance: Patient tolerated the procedure well with no immediate complications.  MDM   Final diagnoses:  Fall    Patient is able to ambulate without difficulty.  She is advised to follow-up with her primary care Dr. for recheck.  Told to return here as needed.  Family agrees to the  plan and all questions were answered    Carlyle Dolly, PA-C 08/13/14 1610  Pricilla Loveless, MD 08/18/14 [phone]

## 2014-08-09 NOTE — ED Notes (Signed)
Patient is in xray at this time; triage completed with daughter and son. Will physically assess patient when she returns from xray.

## 2014-08-10 ENCOUNTER — Ambulatory Visit: Payer: BLUE CROSS/BLUE SHIELD | Admitting: Cardiovascular Disease

## 2014-08-17 ENCOUNTER — Ambulatory Visit (INDEPENDENT_AMBULATORY_CARE_PROVIDER_SITE_OTHER): Payer: BLUE CROSS/BLUE SHIELD | Admitting: Pharmacist Clinician (PhC)/ Clinical Pharmacy Specialist

## 2014-08-17 DIAGNOSIS — Z7901 Long term (current) use of anticoagulants: Secondary | ICD-10-CM

## 2014-08-17 DIAGNOSIS — I4891 Unspecified atrial fibrillation: Secondary | ICD-10-CM

## 2014-08-17 LAB — POCT INR: INR: 3.4

## 2014-08-20 ENCOUNTER — Encounter: Payer: Self-pay | Admitting: Cardiovascular Disease

## 2014-09-16 ENCOUNTER — Telehealth: Payer: Self-pay | Admitting: Cardiovascular Disease

## 2014-09-16 NOTE — Telephone Encounter (Signed)
Closed encounter °

## 2014-09-17 NOTE — Progress Notes (Signed)
Patient ID: Rebecca Bradshaw, female   DOB: Nov 25, 1930, 81 y.o.   MRN: [id number]     Cardiology Office Note   Date:  09/17/2014   ID:  Rebecca Bradshaw, DOB 05/09/1931, MRN [id number]  PCP:  Johny Blamer, MD  Cardiologist:   Thurmon Fair, MD   No chief complaint on file.     History of Present Illness: Rebecca Bradshaw is a 81 y.o. female who presents for permanent atrial fibrillation. She fell and suffered a left eyebroow laceration on February 28, probably due to orthostatic hypotension. She got up quickly at night to use the restroom and fell against her table.Probably lost consciousness, but unclear whether this was before or after the fall. Luckily, no intracranial or other serious bleeding.  NO other episodes of fall or syncope.  Past Medical History  Diagnosis Date  . Essential hypertension 06/30/2013  . Hyperlipidemia     on statin therapy  . Arrhythmia 2014    chronic/permanent atrial fibrillation -rate control with carvedilol and anticoag wth warfarin  . History of diverticulitis   . Chronic diarrhea   . A-fib     Past Surgical History  Procedure Laterality Date  . Transesophageal echocardiogram  12/10/2007    normal left ventricle ; normal aortic valve there was trivial  valvular regurg; left atium markedly dilated no evidence for atrial thrombus,; mild to moderate tricuspid valvular regurg. , right atrium markedly dilated  . Doppler echocardiography  10/17/2007    EF=>55%; -LA -moderated diltaed; RA-moderately; borderline mitral valve prolapse of AMVL- moderate mitral regurg.; moderatetricuspid regurg.--rgt ventricular systolic pressure elevated 30-40 mmHg  . Nm myoview ltd  06/21/2011    EF 78%  LV systolic function normal - NORMAL STUDY  . Abdominal  aorta doppler  10/17/2007    normal taper--normal duplex     Current Outpatient Prescriptions  Medication Sig Dispense Refill  . calcium-vitamin D (OSCAL WITH D) 500-200 MG-UNIT per tablet Take 1 tablet by  mouth daily.    . Carboxymethylcellul-Glycerin (OPTIVE) 0.5-0.9 % SOLN Place 1 drop into both eyes 2 (two) times daily.     . carvedilol (COREG) 12.5 MG tablet Take 12.5 mg by mouth 2 (two) times daily with a meal.    . cholecalciferol (VITAMIN D) 1000 UNITS tablet Take 1,000 Units by mouth daily.    . diazepam (VALIUM) 5 MG tablet Take 5 mg by mouth 2 (two) times daily.    . meclizine (ANTIVERT) 25 MG tablet Take 25 mg by mouth 3 (three) times daily as needed for dizziness.    . pravastatin (PRAVACHOL) 40 MG tablet Take 40 mg by mouth daily.    . traMADol (ULTRAM) 50 MG tablet Take 1 tablet (50 mg total) by mouth every 6 (six) hours as needed. 15 tablet 0  . warfarin (COUMADIN) 5 MG tablet Take 1 tablet by mouth daily as directed by coumadin clinic. <PLEASE MAKE APPOINTMENT WITH DR. C FOR REFILLS> 90 tablet 0   No current facility-administered medications for this visit.    Allergies:   Metoprolol; Codeine; and Morphine and related    Social History:  The patient  reports that she has never smoked. She does not have any smokeless tobacco history on file. She reports that she does not drink alcohol or use illicit drugs.   Family History:  The patient's family history includes Cancer in her brother; Diabetes in her sister; Heart attack in her father; Heart disease in her mother.    ROS:  Please see  the history of present illness. The patient specifically denies any chest pain at rest or with exertion, dyspnea at rest or with exertion, orthopnea, paroxysmal nocturnal dyspnea, palpitations, focal neurological deficits, intermittent claudication, lower extremity edema, unexplained weight gain, cough, hemoptysis or wheezing.  The patient also denies abdominal pain, nausea, vomiting, dysphagia, diarrhea, constipation, polyuria, polydipsia, dysuria, hematuria, frequency, urgency, abnormal bleeding or bruising, fever, chills, unexpected weight changes, mood swings, change in skin or hair texture,  change in voice quality, auditory or visual problems, allergic reactions or rashes, new musculoskeletal complaints other than usual "aches and pains".  All other systems are reviewed and negative.    PHYSICAL EXAM: VS:  There were no vitals taken for this visit. , BMI There is no weight on file to calculate BMI.  General: Alert, oriented x3, no distress Head: no evidence of trauma, PERRL, EOMI, no exophtalmos or lid lag, no myxedema, no xanthelasma; normal ears, nose and oropharynx Neck: normal jugular venous pulsations and no hepatojugular reflux; brisk carotid pulses without delay and no carotid bruits Chest: clear to auscultation, no signs of consolidation by percussion or palpation, normal fremitus, symmetrical and full respiratory excursions Cardiovascular: normal position and quality of the apical impulse, regular rhythm, normal first and second heart sounds, no murmurs, rubs or gallops Abdomen: no tenderness or distention, no masses by palpation, no abnormal pulsatility or arterial bruits, normal bowel sounds, no hepatosplenomegaly Extremities: no clubbing, cyanosis or edema; 2+ radial, ulnar and brachial pulses bilaterally; 2+ right femoral, posterior tibial and dorsalis pedis pulses; 2+ left femoral, posterior tibial and dorsalis pedis pulses; no subclavian or femoral bruits Neurological: grossly nonfocal Psych: euthymic mood, full affect   EKG:  EKG is ordered today. The ekg ordered today demonstrates atrial fibrillation, LAFB   Recent Labs: 04/07/2014: ALT 12; BUN 16; Creatinine 0.92; Hemoglobin 13.1; Platelets 147*; Potassium 4.2; Sodium 142    Lipid Panel No results found for: CHOL, TRIG, HDL, CHOLHDL, VLDL, LDLCALC, LDLDIRECT    Wt Readings from Last 3 Encounters:  04/07/14 140 lb (63.504 kg)  06/26/13 130 lb 9.6 oz (59.24 kg)      Other studies Reviewed: Additional studies/ records that were reviewed today include: ED records/imaging.  ASSESSMENT AND PLAN:  We  discussed the risks of serious bleeding with anticoagulation and falls versus the risk of embolic stroke if we stop warfarin. Discussed ways to reduce injury. I suggested a bedside commode. Move the table and other obstacles out of her path, remove rugs or other sources of tripping. Discussed ways to mitigate orthostatic hypotension, which likely contributed to her fall. Use benzodiazepines sparingly, if at all.  Will continue anticoagulation for now, but the threshold for discontinuation is low. CHADSVasc 3. History of lacunar strokes by CT/MRI, no embolic events. Keep INR 2.0-2.5.   Current medicines are reviewed at length with the patient today.  The patient does not have concerns regarding medicines.  The following changes have been made:  no change  Labs/ tests ordered today include:  No orders of the defined types were placed in this encounter.   Patient Instructions  Dr. Royann Shiversroitoru recommends that you schedule a follow-up appointment in: 3 months.  An order has been placed for a bedside commode.   Joie BimlerSigned, Faydra Korman, MD  09/17/2014 3:24 PM    Thurmon FairMihai Chania Kochanski, MD, [facility] Center[facility] [phone] office [phone] pager

## 2014-09-18 ENCOUNTER — Ambulatory Visit (INDEPENDENT_AMBULATORY_CARE_PROVIDER_SITE_OTHER): Payer: Medicare Other | Admitting: Cardiovascular Disease

## 2014-09-18 ENCOUNTER — Encounter: Payer: Self-pay | Admitting: Cardiovascular Disease

## 2014-09-18 ENCOUNTER — Ambulatory Visit (INDEPENDENT_AMBULATORY_CARE_PROVIDER_SITE_OTHER): Payer: Medicare Other | Admitting: Pharmacist Clinician (PhC)/ Clinical Pharmacy Specialist

## 2014-09-18 VITALS — BP 152/90 | HR 78 | Ht 66.0 in | Wt 140.7 lb

## 2014-09-18 DIAGNOSIS — R42 Dizziness and giddiness: Secondary | ICD-10-CM | POA: Diagnosis not present

## 2014-09-18 DIAGNOSIS — Z7901 Long term (current) use of anticoagulants: Secondary | ICD-10-CM | POA: Diagnosis not present

## 2014-09-18 DIAGNOSIS — W19XXXS Unspecified fall, sequela: Secondary | ICD-10-CM

## 2014-09-18 DIAGNOSIS — I4891 Unspecified atrial fibrillation: Secondary | ICD-10-CM

## 2014-09-18 LAB — POCT INR: INR: 3

## 2014-09-18 NOTE — Patient Instructions (Addendum)
Dr. Royann Shiversroitoru recommends that you schedule a follow-up appointment in: 3 months.  An order has been placed for a bedside commode.

## 2014-09-19 DIAGNOSIS — W19XXXA Unspecified fall, initial encounter: Secondary | ICD-10-CM | POA: Insufficient documentation

## 2014-09-21 ENCOUNTER — Telehealth: Payer: Self-pay | Admitting: Cardiovascular Disease

## 2014-09-21 NOTE — Telephone Encounter (Signed)
She would like for Dr C to call her,she have some concerns avout her mother condition.

## 2014-09-21 NOTE — Telephone Encounter (Signed)
Spoke with pt dtr, questions regarding bedside commode and warfarin answered.

## 2014-09-29 ENCOUNTER — Emergency Department (HOSPITAL_COMMUNITY): Payer: Medicare Other

## 2014-09-29 ENCOUNTER — Emergency Department (HOSPITAL_COMMUNITY)
Admission: EM | Admit: 2014-09-29 | Discharge: 2014-09-29 | Disposition: A | Discharge status: Home / Self Care | Payer: Medicare Other | Attending: Emergency Medicine | Admitting: Emergency Medicine

## 2014-09-29 ENCOUNTER — Encounter (HOSPITAL_COMMUNITY): Payer: Self-pay

## 2014-09-29 DIAGNOSIS — Z7901 Long term (current) use of anticoagulants: Secondary | ICD-10-CM | POA: Diagnosis not present

## 2014-09-29 DIAGNOSIS — Y9301 Activity, walking, marching and hiking: Secondary | ICD-10-CM | POA: Diagnosis not present

## 2014-09-29 DIAGNOSIS — Z79899 Other long term (current) drug therapy: Secondary | ICD-10-CM | POA: Insufficient documentation

## 2014-09-29 DIAGNOSIS — I4891 Unspecified atrial fibrillation: Secondary | ICD-10-CM | POA: Diagnosis not present

## 2014-09-29 DIAGNOSIS — S99912A Unspecified injury of left ankle, initial encounter: Secondary | ICD-10-CM

## 2014-09-29 DIAGNOSIS — S9002XA Contusion of left ankle, initial encounter: Secondary | ICD-10-CM | POA: Insufficient documentation

## 2014-09-29 DIAGNOSIS — W1839XA Other fall on same level, initial encounter: Secondary | ICD-10-CM | POA: Insufficient documentation

## 2014-09-29 DIAGNOSIS — D689 Coagulation defect, unspecified: Secondary | ICD-10-CM | POA: Diagnosis not present

## 2014-09-29 DIAGNOSIS — E785 Hyperlipidemia, unspecified: Secondary | ICD-10-CM | POA: Insufficient documentation

## 2014-09-29 DIAGNOSIS — Y998 Other external cause status: Secondary | ICD-10-CM | POA: Insufficient documentation

## 2014-09-29 DIAGNOSIS — W19XXXA Unspecified fall, initial encounter: Secondary | ICD-10-CM

## 2014-09-29 DIAGNOSIS — I10 Essential (primary) hypertension: Secondary | ICD-10-CM | POA: Insufficient documentation

## 2014-09-29 DIAGNOSIS — Z8719 Personal history of other diseases of the digestive system: Secondary | ICD-10-CM | POA: Insufficient documentation

## 2014-09-29 DIAGNOSIS — M25579 Pain in unspecified ankle and joints of unspecified foot: Secondary | ICD-10-CM

## 2014-09-29 DIAGNOSIS — Y929 Unspecified place or not applicable: Secondary | ICD-10-CM | POA: Insufficient documentation

## 2014-09-29 DIAGNOSIS — R42 Dizziness and giddiness: Secondary | ICD-10-CM | POA: Diagnosis not present

## 2014-09-29 LAB — CBC
HCT: 37 % (ref 36.0–46.0)
HEMOGLOBIN: 12.1 g/dL (ref 12.0–15.0)
MCH: 28 pg (ref 26.0–34.0)
MCHC: 32.7 g/dL (ref 30.0–36.0)
MCV: 85.6 fL (ref 78.0–100.0)
Platelets: 142 10*3/uL — ABNORMAL LOW (ref 150–400)
RBC: 4.32 MIL/uL (ref 3.87–5.11)
RDW: 13.9 % (ref 11.5–15.5)
WBC: 5.8 10*3/uL (ref 4.0–10.5)

## 2014-09-29 LAB — BASIC METABOLIC PANEL
ANION GAP: 9 (ref 5–15)
BUN: 20 mg/dL (ref 6–23)
CO2: 29 mmol/L (ref 19–32)
Calcium: 8.9 mg/dL (ref 8.4–10.5)
Chloride: 99 mmol/L (ref 96–112)
Creatinine, Ser: 0.9 mg/dL (ref 0.50–1.10)
GFR calc Af Amer: 68 mL/min — ABNORMAL LOW (ref >90)
GFR calc non Af Amer: 58 mL/min — ABNORMAL LOW (ref >90)
Glucose, Bld: 100 mg/dL — ABNORMAL HIGH (ref 70–99)
Potassium: 3.9 mmol/L (ref 3.5–5.1)
SODIUM: 137 mmol/L (ref 135–145)

## 2014-09-29 LAB — PROTIME-INR
INR: 2.26 — ABNORMAL HIGH (ref 0.00–1.49)
PROTHROMBIN TIME: 25.1 s — AB (ref 11.6–15.2)

## 2014-09-29 NOTE — ED Notes (Addendum)
Pt had syncopal episode and fell, pt is on blood thinners, complains of ankle injury to left

## 2014-09-29 NOTE — Progress Notes (Signed)
Orthopedic Tech Progress Note Patient Details:  Rebecca BeetsLucy F Bradshaw 10/25/1930 [id number] Per verbal order of ED PA, applied CAM walker to LLE.  Pulses, sensation, motion intact before and after application. Capillary refill less than 2 seconds before and after application. Ortho Devices Type of Ortho Device: CAM walker Ortho Device/Splint Location: LLE Ortho Device/Splint Interventions: Application   Lesle ChrisGilliland, Cosmo Tetreault L 09/29/2014, 10:39 PM

## 2014-09-29 NOTE — ED Provider Notes (Signed)
CSN: [id number]     Arrival date & time 09/29/14  1916 History   First MD Initiated Contact with Patient 09/29/14 2046     Chief Complaint  Patient presents with  . Ankle Pain     (Consider location/radiation/quality/duration/timing/severity/associated sxs/prior Treatment) Patient is a 81 y.o. female presenting with ankle pain. The history is provided by the patient. No language interpreter was used.  Ankle Pain Location:  Leg Time since incident:  9 hours Injury: yes   Mechanism of injury: fall   Leg location:  L lower leg Pain details:    Quality:  Aching Chronicity:  New Dislocation: no   Foreign body present:  No foreign bodies Associated symptoms: no back pain, no fever and no neck pain   Associated symptoms comment:  She states that around lunch time today she got up from the couch too quickly and became lightheaded. She fell while walking onto the cough, with an isolated left lower leg injury with unknown mechanism. No syncope, chest pain or SOB. She denies neck or abdominal pain. No nausea or vomiting. She reports a history of atrial fibrillation and similar lightheadedness when she does not get up slowly from sitting or lying, and stand for a moment prior to walking. She states she has been ambulatory since the fall with pain progressing over time.    Past Medical History  Diagnosis Date  . Essential hypertension 06/30/2013  . Hyperlipidemia     on statin therapy  . Arrhythmia 2014    chronic/permanent atrial fibrillation -rate control with carvedilol and anticoag wth warfarin  . History of diverticulitis   . Chronic diarrhea   . A-fib    Past Surgical History  Procedure Laterality Date  . Transesophageal echocardiogram  12/10/2007    normal left ventricle ; normal aortic valve there was trivial  valvular regurg; left atium markedly dilated no evidence for atrial thrombus,; mild to moderate tricuspid valvular regurg. , right atrium markedly dilated  . Doppler  echocardiography  10/17/2007    EF=>55%; -LA -moderated diltaed; RA-moderately; borderline mitral valve prolapse of AMVL- moderate mitral regurg.; moderatetricuspid regurg.--rgt ventricular systolic pressure elevated 30-40 mmHg  . Nm myoview ltd  06/21/2011    EF 78%  LV systolic function normal - NORMAL STUDY  . Abdominal  aorta doppler  10/17/2007    normal taper--normal duplex   Family History  Problem Relation Age of Onset  . Heart disease Mother   . Heart attack Father   . Diabetes Sister   . Cancer Brother    History  Substance Use Topics  . Smoking status: Never Smoker   . Smokeless tobacco: Not on file  . Alcohol Use: No   OB History    No data available     Review of Systems  Constitutional: Negative for fever and chills.  Eyes: Negative for visual disturbance.  Respiratory: Negative.  Negative for shortness of breath.   Cardiovascular: Negative.  Negative for chest pain.  Gastrointestinal: Negative.  Negative for nausea and abdominal pain.  Musculoskeletal: Negative for back pain and neck pain.       See HPI.  Skin: Negative.  Negative for wound.  Neurological: Negative.  Negative for weakness and numbness.      Allergies  Metoprolol; Codeine; and Morphine and related  Home Medications   Prior to Admission medications   Medication Sig Start Date End Date Taking? Authorizing Provider  calcium-vitamin D (OSCAL WITH D) 500-200 MG-UNIT per tablet Take 1 tablet by mouth  daily.    Historical Provider, MD  Carboxymethylcellul-Glycerin (OPTIVE) 0.5-0.9 % SOLN Place 1 drop into both eyes 2 (two) times daily.     Historical Provider, MD  carvedilol (COREG) 12.5 MG tablet Take 12.5 mg by mouth 2 (two) times daily with a meal.    Historical Provider, MD  cholecalciferol (VITAMIN D) 1000 UNITS tablet Take 1,000 Units by mouth daily.    Historical Provider, MD  diazepam (VALIUM) 5 MG tablet Take 5 mg by mouth 2 (two) times daily.    Historical Provider, MD  meclizine  (ANTIVERT) 25 MG tablet Take 25 mg by mouth 3 (three) times daily as needed for dizziness.    Historical Provider, MD  pravastatin (PRAVACHOL) 40 MG tablet Take 40 mg by mouth daily.    Historical Provider, MD  warfarin (COUMADIN) 5 MG tablet Take 1 tablet by mouth daily as directed by coumadin clinic. <PLEASE MAKE APPOINTMENT WITH DR. C FOR REFILLS> 06/18/14   Mihai Croitoru, MD   BP 150/86 mmHg  Pulse 85  Temp(Src) 98.6 F (37 C) (Oral)  Resp 18  Ht 5\' 6"  (1.676 m)  Wt 140 lb (63.504 kg)  BMI 22.61 kg/m2  SpO2 99% Physical Exam  Constitutional: She is oriented to person, place, and time. She appears well-developed and well-nourished. No distress.  HENT:  Head: Normocephalic and atraumatic.  Eyes: Conjunctivae are normal.  Neck: Normal range of motion. Neck supple.  Pulmonary/Chest: Effort normal.  Abdominal: Soft. There is no tenderness.  Musculoskeletal: Normal range of motion.  Left ankle moderately swollen with ecchymosis laterally. FROM ankle and all digits of foot. Tender to mid-shaft tibia without bony deformity or discoloration. No knee, thigh or hip tenderness. No lumbar or paralumbar tenderness.   Neurological: She is alert and oriented to person, place, and time.    ED Course  Procedures (including critical care time) Labs Review Labs Reviewed  CBC - Abnormal; Notable for the following:    Platelets 142 (*)    All other components within normal limits  BASIC METABOLIC PANEL - Abnormal; Notable for the following:    Glucose, Bld 100 (*)    GFR calc non Af Amer 58 (*)    GFR calc Af Amer 68 (*)    All other components within normal limits  PROTIME-INR - Abnormal; Notable for the following:    Prothrombin Time 25.1 (*)    INR 2.26 (*)    All other components within normal limits    Imaging Review Dg Ankle Complete Left  09/29/2014   CLINICAL DATA:  Syncopal episode today. Twisting left ankle. Lateral ankle pain. Initial encounter.  EXAM: LEFT ANKLE COMPLETE - 3+  VIEW  COMPARISON:  04/19/2009  FINDINGS: There is prominent soft tissue swelling about the ankle, predominantly laterally. There is disruption of the cortex at the tip of the lateral malleolus with a curvilinear osseous fragment noted along its medial aspect, suggestive of an acute, small avulsion fracture. The configuration is different than the tiny fracture seen in this region on the 2010 study. The distal tibia is intact. There is no dislocation. No radiopaque foreign body.  IMPRESSION: Marked lateral ankle soft tissue swelling with evidence of a small, acute avulsion fracture of the tip of the lateral malleolus.   Electronically Signed   By: Sebastian AcheAllen  Grady   On: 09/29/2014 20:31     EKG Interpretation None      MDM   Final diagnoses:  Ankle pain    1. Fall 2. Ankle  sprain injury 3. Coagulopathy  Negative imaging of left lower extremity. She is appropriately anticoagulated for history of A-fib. No fractures identified. No other injury suspected. She is evaluated by Dr. Clarice Pole and is felt stable for discharge.     Elpidio Anis, PA-C 09/29/14 2217  Arby Barrette, MD 10/07/14 [phone]

## 2014-09-29 NOTE — ED Notes (Signed)
Ortho at bedside.

## 2014-09-29 NOTE — Discharge Instructions (Signed)
Fall Prevention and Home Safety Falls cause injuries and can affect all age groups. It is possible to use preventive measures to significantly decrease the likelihood of falls. There are many simple measures which can make your home safer and prevent falls. OUTDOORS  Repair cracks and edges of walkways and driveways.  Remove high doorway thresholds.  Trim shrubbery on the main path into your home.  Have good outside lighting.  Clear walkways of tools, rocks, debris, and clutter.  Check that handrails are not broken and are securely fastened. Both sides of steps should have handrails.  Have leaves, snow, and ice cleared regularly.  Use sand or salt on walkways during winter months.  In the garage, clean up grease or oil spills. BATHROOM  Install night lights.  Install grab bars by the toilet and in the tub and shower.  Use non-skid mats or decals in the tub or shower.  Place a plastic non-slip stool in the shower to sit on, if needed.  Keep floors dry and clean up all water on the floor immediately.  Remove soap buildup in the tub or shower on a regular basis.  Secure bath mats with non-slip, double-sided rug tape.  Remove throw rugs and tripping hazards from the floors. BEDROOMS  Install night lights.  Make sure a bedside light is easy to reach.  Do not use oversized bedding.  Keep a telephone by your bedside.  Have a firm chair with side arms to use for getting dressed.  Remove throw rugs and tripping hazards from the floor. KITCHEN  Keep handles on pots and pans turned toward the center of the stove. Use back burners when possible.  Clean up spills quickly and allow time for drying.  Avoid walking on wet floors.  Avoid hot utensils and knives.  Position shelves so they are not too high or low.  Place commonly used objects within easy reach.  If necessary, use a sturdy step stool with a grab bar when reaching.  Keep electrical cables out of the  way.  Do not use floor polish or wax that makes floors slippery. If you must use wax, use non-skid floor wax.  Remove throw rugs and tripping hazards from the floor. STAIRWAYS  Never leave objects on stairs.  Place handrails on both sides of stairways and use them. Fix any loose handrails. Make sure handrails on both sides of the stairways are as long as the stairs.  Check carpeting to make sure it is firmly attached along stairs. Make repairs to worn or loose carpet promptly.  Avoid placing throw rugs at the top or bottom of stairways, or properly secure the rug with carpet tape to prevent slippage. Get rid of throw rugs, if possible.  Have an electrician put in a light switch at the top and bottom of the stairs. OTHER FALL PREVENTION TIPS  Wear low-heel or rubber-soled shoes that are supportive and fit well. Wear closed toe shoes.  When using a stepladder, make sure it is fully opened and both spreaders are firmly locked. Do not climb a closed stepladder.  Add color or contrast paint or tape to grab bars and handrails in your home. Place contrasting color strips on first and last steps.  Learn and use mobility aids as needed. Install an electrical emergency response system.  Turn on lights to avoid dark areas. Replace light bulbs that burn out immediately. Get light switches that glow.  Arrange furniture to create clear pathways. Keep furniture in the same place.  Firmly attach carpet with non-skid or double-sided tape.  Eliminate uneven floor surfaces.  Select a carpet pattern that does not visually hide the edge of steps.  Be aware of all pets. OTHER HOME SAFETY TIPS  Set the water temperature for 120 F (48.8 C).  Keep emergency numbers on or near the telephone.  Keep smoke detectors on every level of the home and near sleeping areas. Document Released: 05/19/2002 Document Revised: 11/28/2011 Document Reviewed: 08/18/2011 [facility] Patient Information 2015  [place], [place]. This information is not intended to replace advice given to you by your health care provider. Make sure you discuss any questions you have with your health care provider. Cryotherapy Cryotherapy means treatment with cold. Ice or gel packs can be used to reduce both pain and swelling. Ice is the most helpful within the first 24 to 48 hours after an injury or flare-up from overusing a muscle or joint. Sprains, strains, spasms, burning pain, shooting pain, and aches can all be eased with ice. Ice can also be used when recovering from surgery. Ice is effective, has very few side effects, and is safe for most people to use. PRECAUTIONS  Ice is not a safe treatment option for people with:  Raynaud phenomenon. This is a condition affecting small blood vessels in the extremities. Exposure to cold may cause your problems to return.  Cold hypersensitivity. There are many forms of cold hypersensitivity, including:  Cold urticaria. Red, itchy hives appear on the skin when the tissues begin to warm after being iced.  Cold erythema. This is a red, itchy rash caused by exposure to cold.  Cold hemoglobinuria. Red blood cells break down when the tissues begin to warm after being iced. The hemoglobin that carry oxygen are passed into the urine because they cannot combine with blood proteins fast enough.  Numbness or altered sensitivity in the area being iced. If you have any of the following conditions, do not use ice until you have discussed cryotherapy with your caregiver:  Heart conditions, such as arrhythmia, angina, or chronic heart disease.  High blood pressure.  Healing wounds or open skin in the area being iced.  Current infections.  Rheumatoid arthritis.  Poor circulation.  Diabetes. Ice slows the blood flow in the region it is applied. This is beneficial when trying to stop inflamed tissues from spreading irritating chemicals to surrounding tissues. However, if you expose your  skin to cold temperatures for too long or without the proper protection, you can damage your skin or nerves. Watch for signs of skin damage due to cold. HOME CARE INSTRUCTIONS Follow these tips to use ice and cold packs safely.  Place a dry or damp towel between the ice and skin. A damp towel will cool the skin more quickly, so you may need to shorten the time that the ice is used.  For a more rapid response, add gentle compression to the ice.  Ice for no more than 10 to 20 minutes at a time. The bonier the area you are icing, the less time it will take to get the benefits of ice.  Check your skin after 5 minutes to make sure there are no signs of a poor response to cold or skin damage.  Rest 20 minutes or more between uses.  Once your skin is numb, you can end your treatment. You can test numbness by very lightly touching your skin. The touch should be so light that you do not see the skin dimple from the pressure  of your fingertip. When using ice, most people will feel these normal sensations in this order: cold, burning, aching, and numbness.  Do not use ice on someone who cannot communicate their responses to pain, such as small children or people with dementia. HOW TO MAKE AN ICE PACK Ice packs are the most common way to use ice therapy. Other methods include ice massage, ice baths, and cryosprays. Muscle creams that cause a cold, tingly feeling do not offer the same benefits that ice offers and should not be used as a substitute unless recommended by your caregiver. To make an ice pack, do one of the following:  Place crushed ice or a bag of frozen vegetables in a sealable plastic bag. Squeeze out the excess air. Place this bag inside another plastic bag. Slide the bag into a pillowcase or place a damp towel between your skin and the bag.  Mix 3 parts water with 1 part rubbing alcohol. Freeze the mixture in a sealable plastic bag. When you remove the mixture from the freezer, it will be  slushy. Squeeze out the excess air. Place this bag inside another plastic bag. Slide the bag into a pillowcase or place a damp towel between your skin and the bag. SEEK MEDICAL CARE IF:  You develop white spots on your skin. This may give the skin a blotchy (mottled) appearance.  Your skin turns blue or pale.  Your skin becomes waxy or hard.  Your swelling gets worse. MAKE SURE YOU:   Understand these instructions.  Will watch your condition.  Will get help right away if you are not doing well or get worse. Document Released: 01/23/2011 Document Revised: 10/13/2013 Document Reviewed: 01/23/2011 [facility] Patient Information 2015 [place]. This information is not intended to replace advice given to you by your health care provider. Make sure you discuss any questions you have with your health care provider.

## 2014-10-09 ENCOUNTER — Ambulatory Visit (INDEPENDENT_AMBULATORY_CARE_PROVIDER_SITE_OTHER): Payer: Medicare Other | Admitting: Pharmacist Clinician (PhC)/ Clinical Pharmacy Specialist

## 2014-10-09 DIAGNOSIS — Z7901 Long term (current) use of anticoagulants: Secondary | ICD-10-CM

## 2014-10-09 DIAGNOSIS — I4891 Unspecified atrial fibrillation: Secondary | ICD-10-CM | POA: Diagnosis not present

## 2014-10-09 LAB — POCT INR: INR: 2.2

## 2014-10-19 ENCOUNTER — Other Ambulatory Visit: Payer: Self-pay | Admitting: Family Medicine

## 2014-10-19 ENCOUNTER — Ambulatory Visit
Admission: RE | Admit: 2014-10-19 | Discharge: 2014-10-19 | Disposition: A | Discharge status: Home / Self Care | Payer: Medicare Other | Source: Ambulatory Visit | Attending: Family Medicine | Admitting: Family Medicine

## 2014-10-19 DIAGNOSIS — S82899S Other fracture of unspecified lower leg, sequela: Secondary | ICD-10-CM

## 2014-10-29 ENCOUNTER — Telehealth: Payer: Self-pay | Admitting: Cardiovascular Disease

## 2014-10-29 MED ORDER — WARFARIN SODIUM 5 MG PO TABS: ORAL_TABLET | ORAL | Status: DC

## 2014-10-29 NOTE — Telephone Encounter (Signed)
°  1. Which medications need to be refilled? Warfarin  2. Which pharmacy is medication to be sent to?[phone]  3. Do they need a 30 day or 90 day supply? 90 and refills  4. Would they like a call back once the medication has been sent to the pharmacy? yes

## 2014-11-06 ENCOUNTER — Ambulatory Visit (INDEPENDENT_AMBULATORY_CARE_PROVIDER_SITE_OTHER): Payer: Medicare Other | Admitting: Pharmacist Clinician (PhC)/ Clinical Pharmacy Specialist

## 2014-11-06 DIAGNOSIS — I4891 Unspecified atrial fibrillation: Secondary | ICD-10-CM | POA: Diagnosis not present

## 2014-11-06 DIAGNOSIS — Z7901 Long term (current) use of anticoagulants: Secondary | ICD-10-CM

## 2014-11-06 LAB — POCT INR: INR: 2.1

## 2014-12-11 ENCOUNTER — Ambulatory Visit (INDEPENDENT_AMBULATORY_CARE_PROVIDER_SITE_OTHER): Payer: Medicare Other | Admitting: Pharmacist Clinician (PhC)/ Clinical Pharmacy Specialist

## 2014-12-11 DIAGNOSIS — I4891 Unspecified atrial fibrillation: Secondary | ICD-10-CM | POA: Diagnosis not present

## 2014-12-11 DIAGNOSIS — Z7901 Long term (current) use of anticoagulants: Secondary | ICD-10-CM

## 2014-12-11 LAB — POCT INR: INR: 2.3

## 2015-01-13 ENCOUNTER — Ambulatory Visit: Payer: Medicare Other | Admitting: Pharmacist Clinician (PhC)/ Clinical Pharmacy Specialist

## 2015-01-13 ENCOUNTER — Ambulatory Visit (INDEPENDENT_AMBULATORY_CARE_PROVIDER_SITE_OTHER): Payer: Medicare Other | Admitting: Cardiovascular Disease

## 2015-01-13 ENCOUNTER — Telehealth: Payer: Self-pay | Admitting: Cardiovascular Disease

## 2015-01-13 VITALS — BP 142/90 | HR 76 | Ht 66.0 in | Wt 141.8 lb

## 2015-01-13 DIAGNOSIS — I482 Chronic atrial fibrillation, unspecified: Secondary | ICD-10-CM

## 2015-01-13 DIAGNOSIS — R29898 Other symptoms and signs involving the musculoskeletal system: Secondary | ICD-10-CM | POA: Diagnosis not present

## 2015-01-13 DIAGNOSIS — Z7901 Long term (current) use of anticoagulants: Secondary | ICD-10-CM

## 2015-01-13 DIAGNOSIS — I4891 Unspecified atrial fibrillation: Secondary | ICD-10-CM

## 2015-01-13 NOTE — Telephone Encounter (Signed)
Please call,she saw Dr C today. She says Dr C said she needs a walker all the time,she needs his help getting one.

## 2015-01-13 NOTE — Patient Instructions (Signed)
Your physician wants you to follow-up in: 6 Months You will receive a reminder letter in the mail two months in advance. If you don't receive a letter, please call our office to schedule the follow-up appointment.  Your physician has recommended you make the following change in your medication: STOP Coumadin and START 81 mg Aspirin

## 2015-01-13 NOTE — Telephone Encounter (Signed)
Patient would like a prescription mailed to her home address for a walker

## 2015-01-13 NOTE — Progress Notes (Signed)
Patient ID: Rebecca Bradshaw, female   DOB: April 28, 1931, 81 y.o.   MRN: [id number]      Cardiology Office Note   Date:  01/15/2015   ID:  Rebecca Bradshaw, DOB 10-07-1930, MRN [id number]  PCP:  Johny Blamer, MD  Cardiologist:   Thurmon Fair, MD   Chief Complaint  Patient presents with  . Follow-up    had some chest pain during last week   . Shortness of Breath    sometimes, and also feel light headed and dizzy  . Edema    in feet and legs      History of Present Illness: Rebecca Bradshaw is a 81 y.o. female who presents for follow-up of permanent atrial fibrillation and chronic anticoagulation. Since her last appointment she has had one more serious fall leading to a left ankle fracture. Not long before that she had had a fall with head injury, thankfully without intracranial hemorrhage. She continues to appear frail, unsteady and tremulous. I think it is time that we discontinue warfarin anticoagulation.  She denies any other cardiac symptoms. She had some chest pain last week that sounded decidedly musculoskeletal. She frequently feels lightheaded and dizzy. She denies dyspnea, palpitations or syncope. She has not had focal neurological deficits and does not have a history of stroke or TIA. She has occasional pedal edema that always resolves after overnight recumbency and is not particularly troublesome.   By previous echocardiography she has borderline mitral valve prolapse with moderate mitral regurgitation and a moderate biatrial dilatation. He has normal left ventricular systolic function and had a normal myocardial perfusion study within the last 3 years or so.   Past Medical History  Diagnosis Date  . Essential hypertension 06/30/2013  . Hyperlipidemia     on statin therapy  . Arrhythmia 2014    chronic/permanent atrial fibrillation -rate control with carvedilol and anticoag wth warfarin  . History of diverticulitis   . Chronic diarrhea   . A-fib     Past Surgical  History  Procedure Laterality Date  . Transesophageal echocardiogram  12/10/2007    normal left ventricle ; normal aortic valve there was trivial  valvular regurg; left atium markedly dilated no evidence for atrial thrombus,; mild to moderate tricuspid valvular regurg. , right atrium markedly dilated  . Doppler echocardiography  10/17/2007    EF=>55%; -LA -moderated diltaed; RA-moderately; borderline mitral valve prolapse of AMVL- moderate mitral regurg.; moderatetricuspid regurg.--rgt ventricular systolic pressure elevated 30-40 mmHg  . Nm myoview ltd  06/21/2011    EF 78%  LV systolic function normal - NORMAL STUDY  . Abdominal  aorta doppler  10/17/2007    normal taper--normal duplex     Current Outpatient Prescriptions  Medication Sig Dispense Refill  . calcium-vitamin D (OSCAL WITH D) 500-200 MG-UNIT per tablet Take 1 tablet by mouth daily.    . carvedilol (COREG) 12.5 MG tablet Take 12.5 mg by mouth 2 (two) times daily with a meal.    . cholecalciferol (VITAMIN D) 1000 UNITS tablet Take 1,000 Units by mouth daily.    . diazepam (VALIUM) 10 MG tablet Take 1 tablet by mouth daily. Half in the morning and half at night  0  . diazepam (VALIUM) 5 MG tablet Take 2.5 mg by mouth 2 (two) times daily.     . meclizine (ANTIVERT) 25 MG tablet Take 25 mg by mouth 3 (three) times daily as needed for dizziness.    . pravastatin (PRAVACHOL) 40 MG tablet Take 40 mg by  mouth daily.    Marland Kitchen warfarin (COUMADIN) 5 MG tablet Take 1 tablet by mouth daily as directed by coumadin clinic. 90 tablet 1   No current facility-administered medications for this visit.    Allergies:   Metoprolol; Codeine; and Morphine and related    Social History:  The patient  reports that she has never smoked. She does not have any smokeless tobacco history on file. She reports that she does not drink alcohol or use illicit drugs.   Family History:  The patient's family history includes Cancer in her brother; Diabetes in her  sister; Heart attack in her father; Heart disease in her mother.    ROS:  Please see the history of present illness.    Otherwise, review of systems positive for none.   All other systems are reviewed and negative.    PHYSICAL EXAM: VS:  BP 142/90 mmHg  Pulse 76  Ht 5\' 6"  (1.676 m)  Wt 141 lb 12.8 oz (64.32 kg)  BMI 22.90 kg/m2 , BMI Body mass index is 22.9 kg/(m^2).  General: Alert, oriented x3, no distress Head: no evidence of trauma, PERRL, EOMI, no exophtalmos or lid lag, no myxedema, no xanthelasma; normal ears, nose and oropharynx Neck: normal jugular venous pulsations and no hepatojugular reflux; brisk carotid pulses without delay and no carotid bruits Chest: clear to auscultation, no signs of consolidation by percussion or palpation, normal fremitus, symmetrical and full respiratory excursions Cardiovascular: normal position and quality of the apical impulse, irregular rhythm, normal first and second heart sounds, no murmurs, rubs or gallops Abdomen: no tenderness or distention, no masses by palpation, no abnormal pulsatility or arterial bruits, normal bowel sounds, no hepatosplenomegaly Extremities: no clubbing, cyanosis or edema; 2+ radial, ulnar and brachial pulses bilaterally; 2+ right femoral, posterior tibial and dorsalis pedis pulses; 2+ left femoral, posterior tibial and dorsalis pedis pulses; no subclavian or femoral bruits Neurological: grossly nonfocal Psych: euthymic mood, full affect   EKG:  EKG is ordered today. The ekg ordered today demonstrates atrial fibrillation, otherwise normal    Recent Labs: 04/07/2014: ALT 12 09/29/2014: BUN 20; Creatinine, Ser 0.90; Hemoglobin 12.1; Platelets 142*; Potassium 3.9; Sodium 137    Lipid Panel No results found for: CHOL, TRIG, HDL, CHOLHDL, VLDL, LDLCALC, LDLDIRECT    Wt Readings from Last 3 Encounters:  01/13/15 141 lb 12.8 oz (64.32 kg)  09/29/14 140 lb (63.504 kg)  09/18/14 140 lb 11.2 oz (63.821 kg)       ASSESSMENT AND PLAN:  Although her risk for embolic stroke is not trivial (CHADSVasc 3), Rebecca Bradshaw's risk of serious and even fatal injury and hemorrhage is also elevated. We discussed the pros and cons of continued anticoagulation. It is my estimation that bleeding risks now supersede the potential benefit in stroke reduction. I have recommended that she stop taking warfarin. We'll try low-dose aspirin for stroke prevention. We may have to review this issue if she should develop any focal neurological events.   Current medicines are reviewed at length with the patient today.  The patient has concerns regarding medicines.  The following changes have been made:  Stop warfarin.  Labs/ tests ordered today include:  Orders Placed This Encounter  Procedures  . For home use only DME Walker    Patient Instructions  Your physician wants you to follow-up in: 6 Months You will receive a reminder letter in the mail two months in advance. If you don't receive a letter, please call our office to schedule the follow-up appointment.  Your physician has recommended you make the following change in your medication: STOP Coumadin and START 81 mg Aspirin       Joie Bimler, MD  01/15/2015 6:32 PM    Thurmon Fair, MD, [facility] [phone] office [phone] pager

## 2015-01-15 ENCOUNTER — Encounter: Payer: Self-pay | Admitting: Cardiovascular Disease

## 2015-01-15 ENCOUNTER — Other Ambulatory Visit: Payer: Self-pay | Admitting: *Deleted

## 2015-01-15 DIAGNOSIS — W19XXXS Unspecified fall, sequela: Secondary | ICD-10-CM

## 2015-01-19 NOTE — Telephone Encounter (Signed)
Rx done for standard walker.  Mailed to patient.

## 2015-01-19 NOTE — Addendum Note (Signed)
Addended by: Ronnell Guadalajara A on: 01/19/2015 07:55 AM   Modules accepted: Orders

## 2015-04-26 ENCOUNTER — Ambulatory Visit: Payer: Self-pay | Admitting: Pharmacist Clinician (PhC)/ Clinical Pharmacy Specialist

## 2015-04-26 DIAGNOSIS — Z7901 Long term (current) use of anticoagulants: Secondary | ICD-10-CM

## 2015-04-26 DIAGNOSIS — I4891 Unspecified atrial fibrillation: Secondary | ICD-10-CM

## 2015-07-05 ENCOUNTER — Ambulatory Visit (INDEPENDENT_AMBULATORY_CARE_PROVIDER_SITE_OTHER): Payer: Medicare Other | Admitting: Cardiovascular Disease

## 2015-07-05 ENCOUNTER — Encounter: Payer: Self-pay | Admitting: Cardiovascular Disease

## 2015-07-05 VITALS — BP 140/80 | HR 76 | Ht 66.0 in | Wt 139.2 lb

## 2015-07-05 DIAGNOSIS — I4891 Unspecified atrial fibrillation: Secondary | ICD-10-CM

## 2015-07-05 DIAGNOSIS — I10 Essential (primary) hypertension: Secondary | ICD-10-CM

## 2015-07-05 DIAGNOSIS — E785 Hyperlipidemia, unspecified: Secondary | ICD-10-CM | POA: Diagnosis not present

## 2015-07-05 MED ORDER — ASPIRIN 81 MG PO TABS: 81.0000 mg | ORAL_TABLET | Freq: Every day | ORAL | Status: AC

## 2015-07-05 NOTE — Patient Instructions (Signed)
Dr. Croitoru recommends that you schedule a follow-up appointment in: one year   

## 2015-07-05 NOTE — Progress Notes (Signed)
Patient ID: Rebecca Bradshaw, female   DOB: 1930-09-01, 82 y.o.   MRN: [id number]    Cardiology Office Note    Date:  07/06/2015   ID:  Rebecca Bradshaw, DOB April 18, 1931, MRN [id number]  PCP:  Johny Blamer, MD  Cardiologist:   Thurmon Fair, MD   Chief Complaint  Patient presents with  . 6 month visit    AFIB//pt c/o occasional chest discomfort from indigestion, dizziness when she moves her head too quickly or when she lays down    History of Present Illness:  Rebecca Bradshaw is a 82 y.o. female with permanent atrial fibrillation and HTN. Although she is still living independently, she remains quite unsteady and prone to falls. For this reason,  She is not on anticoagulation any longer.  She has no cardiac complaints She has had a variety of tooth problems. Her dentist refused to extract one of her teeth, which made her upset.  By previous echocardiography she has borderline mitral valve prolapse with moderate mitral regurgitation and a moderate biatrial dilatation. She has normal left ventricular systolic function and had a normal myocardial perfusion study within the last 3 years or so.  Past Medical History  Diagnosis Date  . Essential hypertension 06/30/2013  . Hyperlipidemia     on statin therapy  . Arrhythmia 2014    chronic/permanent atrial fibrillation -rate control with carvedilol and anticoag wth warfarin  . History of diverticulitis   . Chronic diarrhea   . A-fib [facility])     Past Surgical History  Procedure Laterality Date  . Transesophageal echocardiogram  12/10/2007    normal left ventricle ; normal aortic valve there was trivial  valvular regurg; left atium markedly dilated no evidence for atrial thrombus,; mild to moderate tricuspid valvular regurg. , right atrium markedly dilated  . Doppler echocardiography  10/17/2007    EF=>55%; -LA -moderated diltaed; RA-moderately; borderline mitral valve prolapse of AMVL- moderate mitral regurg.; moderatetricuspid  regurg.--rgt ventricular systolic pressure elevated 30-40 mmHg  . Nm myoview ltd  06/21/2011    EF 78%  LV systolic function normal - NORMAL STUDY  . Abdominal  aorta doppler  10/17/2007    normal taper--normal duplex    Outpatient Prescriptions Prior to Visit  Medication Sig Dispense Refill  . calcium-vitamin D (OSCAL WITH D) 500-200 MG-UNIT per tablet Take 1 tablet by mouth daily.    . carvedilol (COREG) 12.5 MG tablet Take 12.5 mg by mouth 2 (two) times daily with a meal.    . cholecalciferol (VITAMIN D) 1000 UNITS tablet Take 1,000 Units by mouth daily.    . diazepam (VALIUM) 10 MG tablet Take 1 tablet by mouth daily. Half in the morning and half at night  0  . meclizine (ANTIVERT) 25 MG tablet Take 25 mg by mouth 3 (three) times daily as needed for dizziness.    . pravastatin (PRAVACHOL) 40 MG tablet Take 40 mg by mouth daily.    Marland Kitchen warfarin (COUMADIN) 5 MG tablet Take 1 tablet by mouth daily as directed by coumadin clinic. 90 tablet 1  . diazepam (VALIUM) 5 MG tablet Take 2.5 mg by mouth 2 (two) times daily.      No facility-administered medications prior to visit.     Allergies:   Metoprolol; Codeine; and Morphine and related   Social History   Social History  . Marital Status: Widowed    Spouse Name: N/A  . Number of Children: N/A  . Years of Education: N/A   Social History  Main Topics  . Smoking status: Never Smoker   . Smokeless tobacco: None  . Alcohol Use: No  . Drug Use: No  . Sexual Activity: Not Asked   Other Topics Concern  . None   Social History Narrative     Family History:  The patient's family history includes Cancer in her brother; Diabetes in her sister; Heart attack in her father; Heart disease in her mother.   ROS:   Please see the history of present illness.    ROS All other systems reviewed and are negative.   PHYSICAL EXAM:   VS:  BP 140/80 mmHg  Pulse 76  Ht  (1.676 m)  Wt 139 lb 3.2 oz (63.141 kg)  BMI 22.48 kg/m2   GEN: Well  nourished, well developed, in no acute distress HEENT: normal Neck: no JVD, carotid bruits, or masses Cardiac: irregular; no murmurs, rubs, or gallops,no edema  Respiratory:  clear to auscultation bilaterally, normal work of breathing GI: soft, nontender, nondistended, + BS MS: no deformity or atrophy Skin: warm and dry, no rash Neuro:  Alert and Oriented x 3, Strength and sensation are intact Psych: euthymic mood, full affect  Wt Readings from Last 3 Encounters:  07/05/15 139 lb 3.2 oz (63.141 kg)  01/13/15 141 lb 12.8 oz (64.32 kg)  09/29/14 140 lb (63.504 kg)      Studies/Labs Reviewed:   EKG:  EKG is ordered today.  The ekg ordered today demonstrates atrial fibrillation, low voltage limb leads  Recent Labs: 09/29/2014: BUN 20; Creatinine, Ser 0.90; Hemoglobin 12.1; Platelets 142*; Potassium 3.9; Sodium 137    ASSESSMENT:    1. Atrial fibrillation, unspecified type (HCC)   2. Essential hypertension   3. Hyperlipidemia      PLAN:  In order of problems listed above:  1. Permanent atrial fibrillation: Good rate control on beta blocker. High embolic risk, but also frequent falls and overall frailty. Now on aspirin for stroke prevention. 2. HTN: Fair blood pressure control 3. HLP: On statin   Medication Adjustments/Labs and Tests Ordered: Current medicines are reviewed at length with the patient today.  Concerns regarding medicines are outlined above.  Medication changes, Labs and Tests ordered today are listed in the Patient Instructions below. Patient Instructions  Dr. Royann Shivers recommends that you schedule a follow-up appointment in: one year   Signed, Thurmon Fair, MD  07/06/2015 2:30 PM    [facility] [address] [place], [place], [place]  [address] Phone: [phone]; Fax: [phone]

## 2015-08-21 IMAGING — CR DG ANKLE COMPLETE 3+V*L*
3 series · 3 of 3 positions shown · non-contrast
Comparison: 09/29/2014

CLINICAL DATA: Left ankle fracture.

EXAM:
LEFT ANKLE COMPLETE - 3+ VIEW

[view not recorded (1 of 3)]
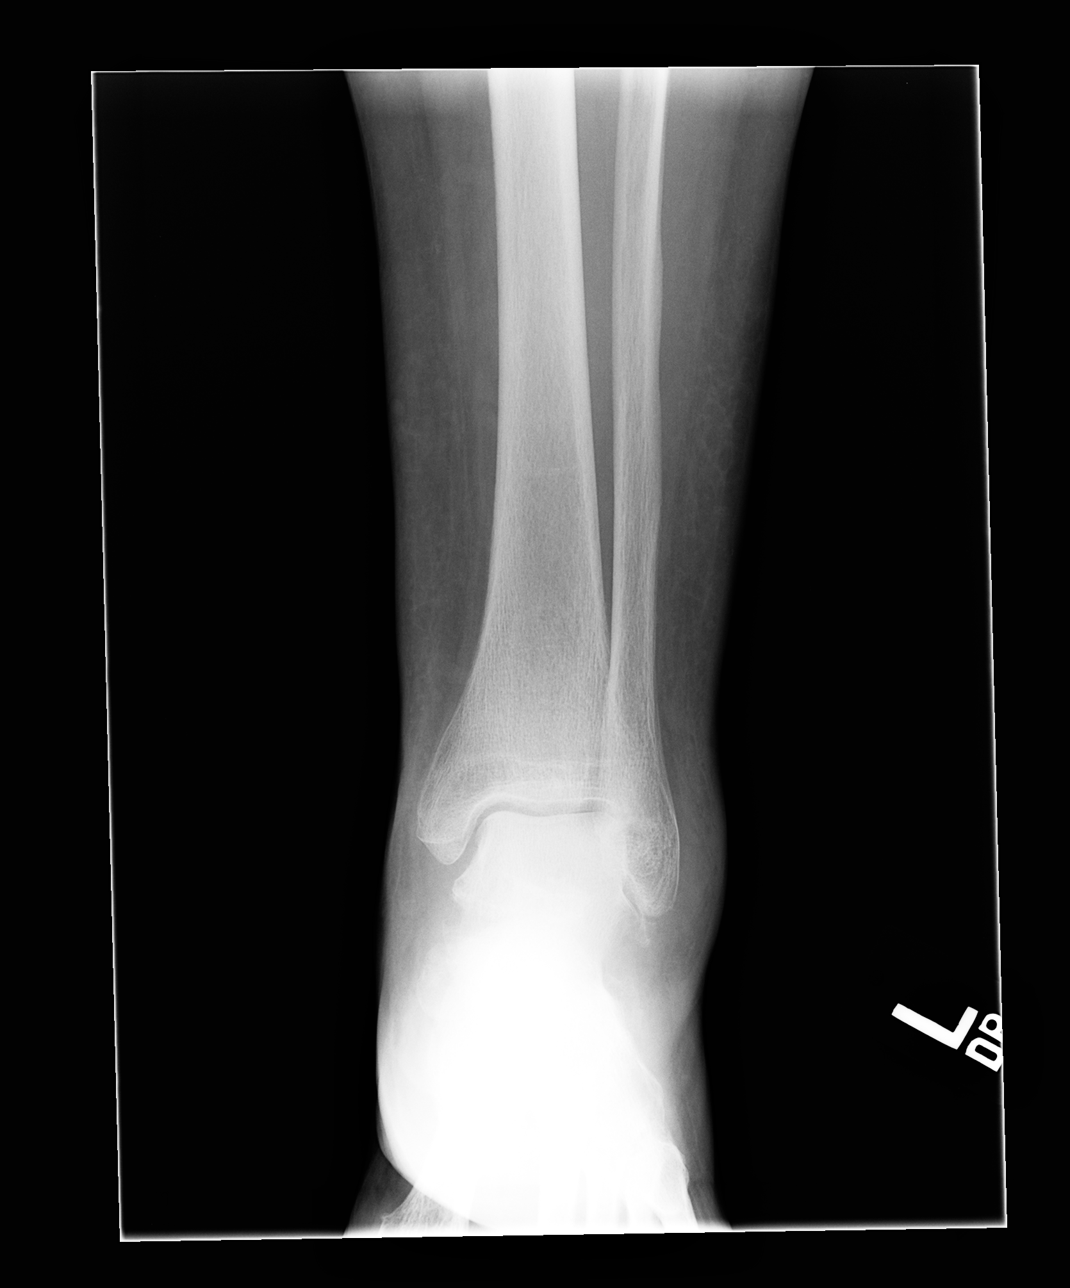

[view not recorded (2 of 3)]
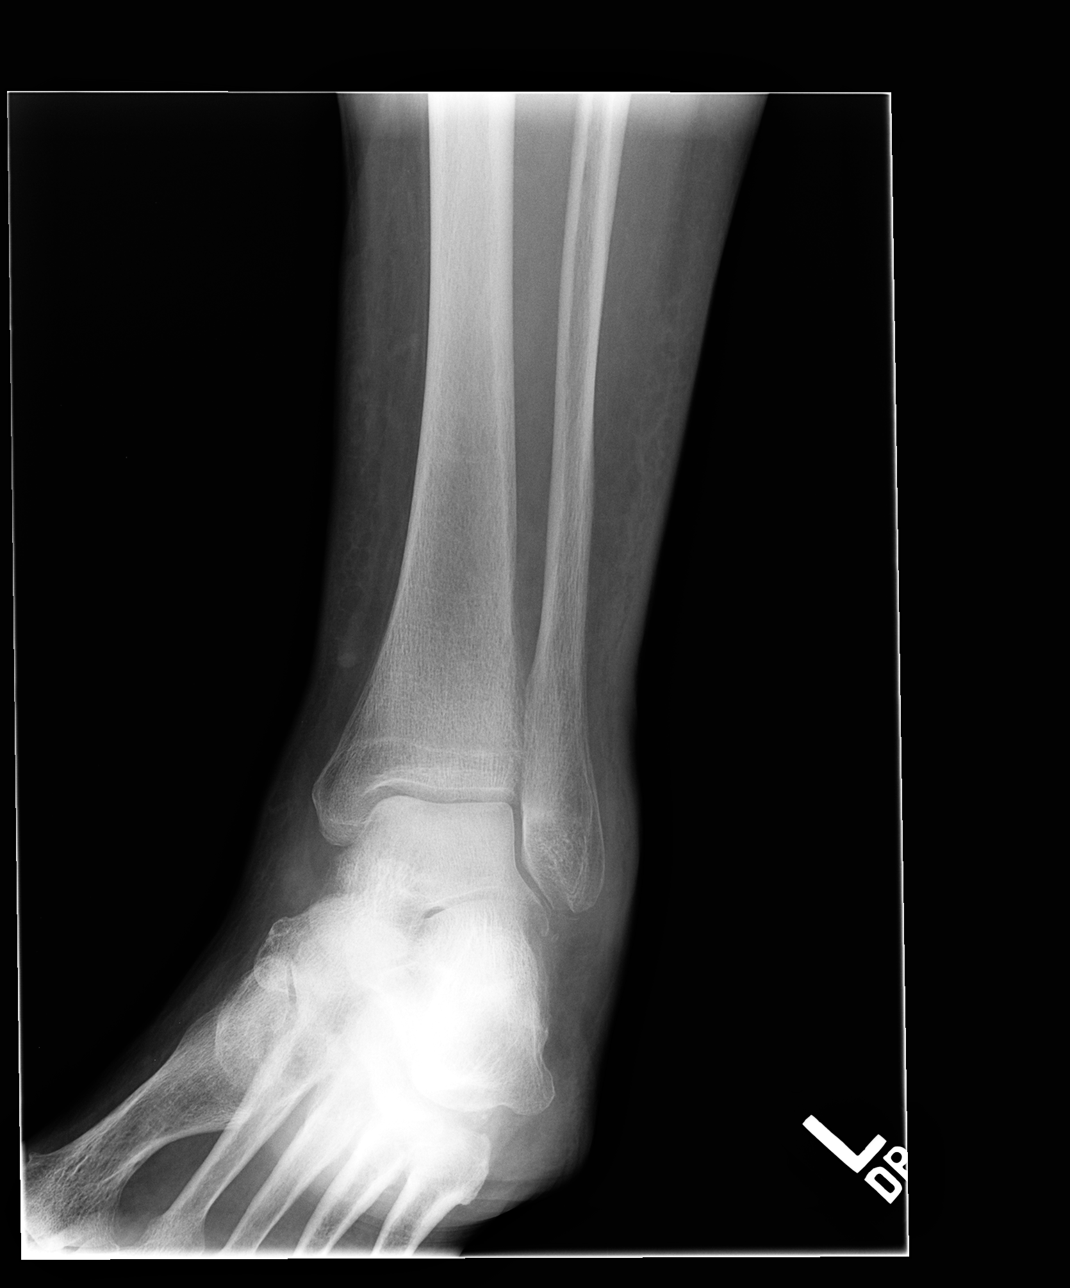

[view not recorded (3 of 3)]
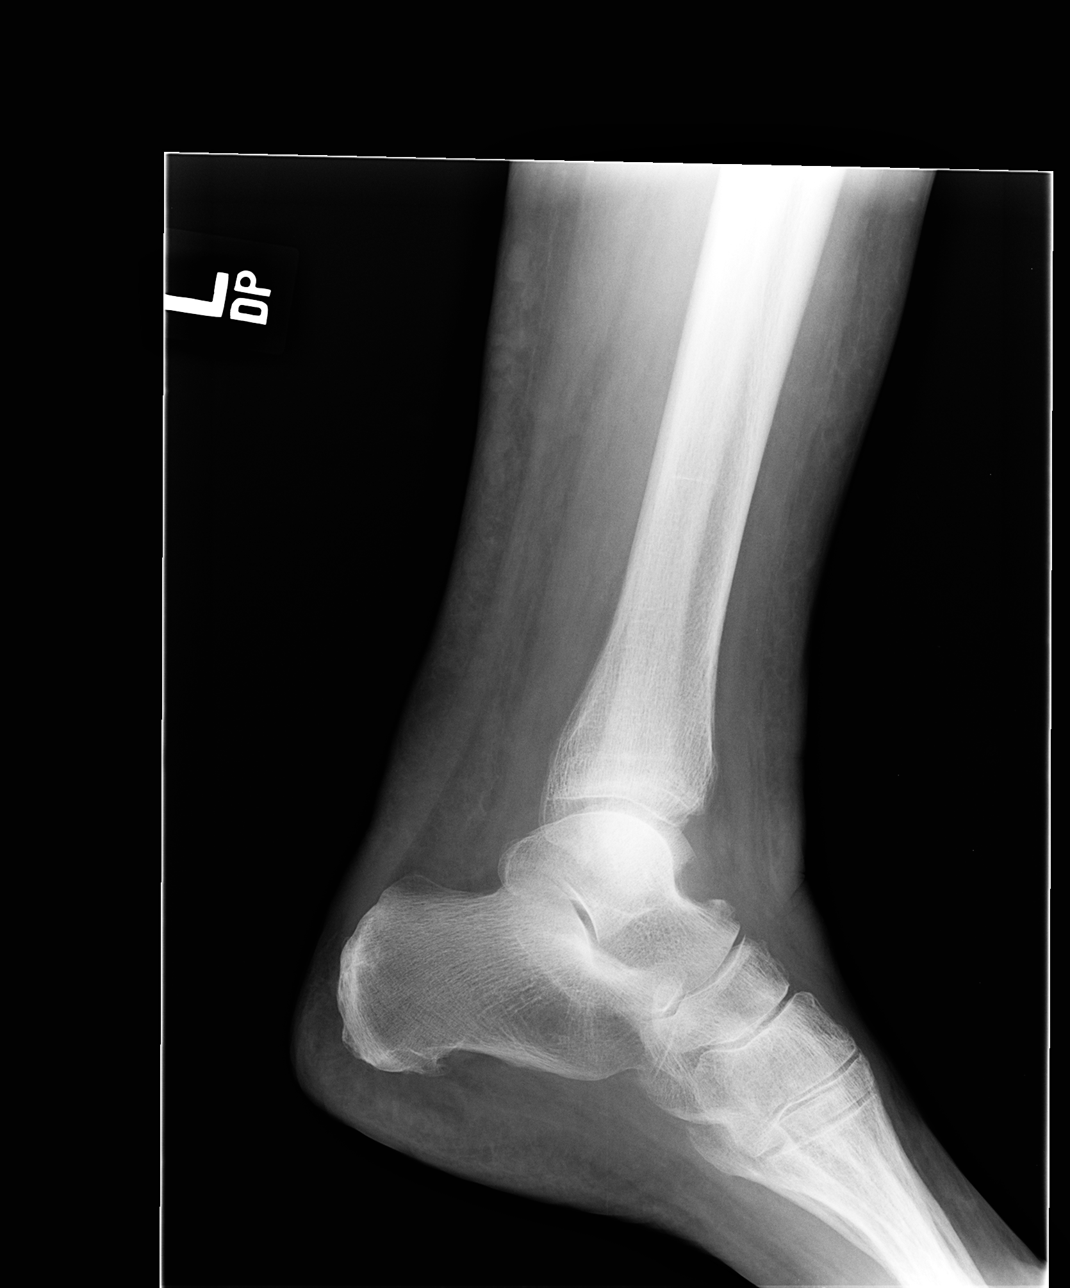

[3 of 3 positions shown; findings below may reference images not displayed]

FINDINGS: There is a tiny sliver of bone adjacent to the lateral malleolus
likely reflecting sequela of prior avulsive injury with overlying
soft tissue swelling. There is no other acute fracture or
dislocation. There is no evidence of arthropathy or other focal bone
abnormality. Soft tissues are unremarkable.
IMPRESSION: Stable tiny sliver of bone adjacent to the lateral malleolus likely
reflecting sequela of prior avulsive injury with overlying soft
tissue swelling.

## 2015-12-31 ENCOUNTER — Other Ambulatory Visit: Payer: Self-pay | Admitting: Family Medicine

## 2015-12-31 ENCOUNTER — Ambulatory Visit
Admission: RE | Admit: 2015-12-31 | Discharge: 2015-12-31 | Disposition: A | Discharge status: Home / Self Care | Payer: Medicare Other | Source: Ambulatory Visit | Attending: Family Medicine | Admitting: Family Medicine

## 2015-12-31 DIAGNOSIS — R1084 Generalized abdominal pain: Secondary | ICD-10-CM

## 2015-12-31 DIAGNOSIS — R251 Tremor, unspecified: Secondary | ICD-10-CM | POA: Diagnosis not present

## 2015-12-31 DIAGNOSIS — R300 Dysuria: Secondary | ICD-10-CM | POA: Diagnosis not present

## 2015-12-31 DIAGNOSIS — K5900 Constipation, unspecified: Secondary | ICD-10-CM | POA: Diagnosis not present

## 2016-01-14 DIAGNOSIS — I4891 Unspecified atrial fibrillation: Secondary | ICD-10-CM | POA: Diagnosis not present

## 2016-01-14 DIAGNOSIS — E7800 Pure hypercholesterolemia, unspecified: Secondary | ICD-10-CM | POA: Diagnosis not present

## 2016-01-14 DIAGNOSIS — R1030 Lower abdominal pain, unspecified: Secondary | ICD-10-CM | POA: Diagnosis not present

## 2016-01-14 DIAGNOSIS — K219 Gastro-esophageal reflux disease without esophagitis: Secondary | ICD-10-CM | POA: Diagnosis not present

## 2016-01-14 DIAGNOSIS — I10 Essential (primary) hypertension: Secondary | ICD-10-CM | POA: Diagnosis not present

## 2016-01-14 DIAGNOSIS — N183 Chronic kidney disease, stage 3 (moderate): Secondary | ICD-10-CM | POA: Diagnosis not present

## 2016-01-14 DIAGNOSIS — R251 Tremor, unspecified: Secondary | ICD-10-CM | POA: Diagnosis not present

## 2016-01-14 DIAGNOSIS — F411 Generalized anxiety disorder: Secondary | ICD-10-CM | POA: Diagnosis not present

## 2016-03-11 DIAGNOSIS — Z23 Encounter for immunization: Secondary | ICD-10-CM | POA: Diagnosis not present

## 2016-07-06 DIAGNOSIS — E119 Type 2 diabetes mellitus without complications: Secondary | ICD-10-CM | POA: Diagnosis not present

## 2016-07-06 DIAGNOSIS — H353121 Nonexudative age-related macular degeneration, left eye, early dry stage: Secondary | ICD-10-CM | POA: Diagnosis not present

## 2016-07-06 DIAGNOSIS — H40013 Open angle with borderline findings, low risk, bilateral: Secondary | ICD-10-CM | POA: Diagnosis not present

## 2016-07-06 DIAGNOSIS — H353111 Nonexudative age-related macular degeneration, right eye, early dry stage: Secondary | ICD-10-CM | POA: Diagnosis not present

## 2016-07-17 DIAGNOSIS — H6123 Impacted cerumen, bilateral: Secondary | ICD-10-CM | POA: Diagnosis not present

## 2016-07-17 DIAGNOSIS — R251 Tremor, unspecified: Secondary | ICD-10-CM | POA: Diagnosis not present

## 2016-07-17 DIAGNOSIS — I10 Essential (primary) hypertension: Secondary | ICD-10-CM | POA: Diagnosis not present

## 2016-07-17 DIAGNOSIS — N183 Chronic kidney disease, stage 3 (moderate): Secondary | ICD-10-CM | POA: Diagnosis not present

## 2016-07-21 ENCOUNTER — Ambulatory Visit (INDEPENDENT_AMBULATORY_CARE_PROVIDER_SITE_OTHER): Payer: Medicare Other | Admitting: Cardiovascular Disease

## 2016-07-21 ENCOUNTER — Encounter: Payer: Self-pay | Admitting: Cardiovascular Disease

## 2016-07-21 VITALS — BP 122/70 | HR 69 | Ht 67.0 in | Wt 136.4 lb

## 2016-07-21 DIAGNOSIS — I480 Paroxysmal atrial fibrillation: Secondary | ICD-10-CM

## 2016-07-21 DIAGNOSIS — I10 Essential (primary) hypertension: Secondary | ICD-10-CM

## 2016-07-21 DIAGNOSIS — E7800 Pure hypercholesterolemia, unspecified: Secondary | ICD-10-CM | POA: Diagnosis not present

## 2016-07-21 NOTE — Patient Instructions (Signed)
Dr Croitoru recommends that you schedule a follow-up appointment in 1 year. You will receive a reminder letter in the mail two months in advance. If you don't receive a letter, please call our office to schedule the follow-up appointment.  If you need a refill on your cardiac medications before your next appointment, please call your pharmacy. 

## 2016-07-21 NOTE — Progress Notes (Signed)
Patient ID: Rebecca Bradshaw, female   DOB: 1931-02-03, 83 y.o.   MRN: [id number]    Cardiology Office Note    Date:  07/21/2016   ID:  Rebecca Bradshaw, DOB 1931-02-03, MRN [id number]  PCP:  Johny BlamerHARRIS, WILLIAM, MD  Cardiologist:   Thurmon FairMihai Keyunna Coco, MD   Chief Complaint  Patient presents with  . Follow-up    History of Present Illness:  Rebecca Bradshaw is a 83 y.o. female with permanent atrial fibrillation and HTN. Although she is still living independently, she remains quite unsteady and prone to falls. For this reason, she is not on anticoagulation any longer. Thankfully she has not had any new falls. She denies palpitations, syncope, leg edema, chest pain or shortness of breath at rest or with activity.  By previous echocardiography she has borderline mitral valve prolapse with moderate mitral regurgitation and a moderate biatrial dilatation. She has normal left ventricular systolic function and had a normal myocardial perfusion study within the last 3 years or so.  Past Medical History:  Diagnosis Date  . A-fib (HCC)   . Arrhythmia 2014   chronic/permanent atrial fibrillation -rate control with carvedilol and anticoag wth warfarin  . Chronic diarrhea   . Essential hypertension 06/30/2013  . History of diverticulitis   . Hyperlipidemia    on statin therapy    Past Surgical History:  Procedure Laterality Date  . abdominal  aorta doppler  10/17/2007   normal taper--normal duplex  . DOPPLER ECHOCARDIOGRAPHY  10/17/2007   EF=>55%; -LA -moderated diltaed; RA-moderately; borderline mitral valve prolapse of AMVL- moderate mitral regurg.; moderatetricuspid regurg.--rgt ventricular systolic pressure elevated 30-40 mmHg  . NM MYOVIEW LTD  06/21/2011   EF 78%  LV systolic function normal - NORMAL STUDY  . TRANSESOPHAGEAL ECHOCARDIOGRAM  12/10/2007   normal left ventricle ; normal aortic valve there was trivial  valvular regurg; left atium markedly dilated no evidence for atrial thrombus,; mild  to moderate tricuspid valvular regurg. , right atrium markedly dilated    Outpatient Medications Prior to Visit  Medication Sig Dispense Refill  . aspirin 81 MG tablet Take 1 tablet (81 mg total) by mouth daily. 30 tablet 6  . calcium-vitamin D (OSCAL WITH D) 500-200 MG-UNIT per tablet Take 1 tablet by mouth daily.    . Carboxymethylcellul-Glycerin (OPTIVE) 0.5-0.9 % SOLN Apply 1 drop to eye daily as needed.    . carvedilol (COREG) 12.5 MG tablet Take 12.5 mg by mouth 2 (two) times daily with a meal.    . cholecalciferol (VITAMIN D) 1000 UNITS tablet Take 1,000 Units by mouth daily.    . diazepam (VALIUM) 10 MG tablet Take 1 tablet by mouth daily. Half in the morning and half at night  0  . fluorometholone (FML) 0.1 % ophthalmic suspension Place 1 drop into both eyes 2 (two) times daily.  0  . meclizine (ANTIVERT) 25 MG tablet Take 25 mg by mouth 3 (three) times daily as needed for dizziness.    . pravastatin (PRAVACHOL) 40 MG tablet Take 40 mg by mouth daily.     No facility-administered medications prior to visit.      Allergies:   Codeine; Metoprolol; Morphine and related; and Pantoprazole   Social History   Social History  . Marital status: Widowed    Spouse name: N/A  . Number of children: N/A  . Years of education: N/A   Social History Main Topics  . Smoking status: Never Smoker  . Smokeless tobacco: Never Used  .  Alcohol use No  . Drug use: No  . Sexual activity: Not Asked   Other Topics Concern  . None   Social History Narrative  . None     Family History:  The patient's family history includes Cancer in her brother; Diabetes in her sister; Heart attack in her father; Heart disease in her mother.   ROS:   Please see the history of present illness.    ROS All other systems reviewed and are negative.   PHYSICAL EXAM:   VS:  BP 122/70   Pulse 69   Ht 5\' 7"  (1.702 m)   Wt 61.9 kg (136 lb 6.4 oz)   BMI 21.36 kg/m    GEN: Well nourished, well developed, in  no acute distress  HEENT: normal  Neck: no JVD, carotid bruits, or masses Cardiac: irregular; no murmurs, rubs, or gallops,no edema  Respiratory:  clear to auscultation bilaterally, normal work of breathing GI: soft, nontender, nondistended, + BS MS: no deformity or atrophy  Skin: warm and dry, no rash Neuro:  Alert and Oriented x 3, Strength and sensation are intact Psych: euthymic mood, full affect  Wt Readings from Last 3 Encounters:  07/21/16 61.9 kg (136 lb 6.4 oz)  07/05/15 63.1 kg (139 lb 3.2 oz)  01/13/15 64.3 kg (141 lb 12.8 oz)      Studies/Labs Reviewed:   EKG:  EKG is ordered today.  The ekg ordered today demonstrates atrial fibrillation, low voltage limb leads, QS V1-V3, QTc 426 ms   ASSESSMENT:    1. Paroxysmal atrial fibrillation (HCC)   2. Essential hypertension   3. Pure hypercholesterolemia      PLAN:  In order of problems listed above:  1. Permanent atrial fibrillation: Good rate control on beta blocker. High embolic risk, but also frequent falls and overall frailty. Now on aspirin for stroke prevention. 2. HTN: Fair blood pressure control 3. HLP: On statin, plans to have labs rechecked at upcoming appointment with primary care provider   Medication Adjustments/Labs and Tests Ordered: Current medicines are reviewed at length with the patient today.  Concerns regarding medicines are outlined above.  Medication changes, Labs and Tests ordered today are listed in the Patient Instructions below. Patient Instructions  Dr Royann Shivers recommends that you schedule a follow-up appointment in 1 year. You will receive a reminder letter in the mail two months in advance. If you don't receive a letter, please call our office to schedule the follow-up appointment.  If you need a refill on your cardiac medications before your next appointment, please call your pharmacy. Signed, Thurmon Fair, MD  07/21/2016 1:01 PM    [facility] [address]  [place], [place], [place]  [address] Phone: [phone]; Fax: [phone]

## 2017-01-23 DIAGNOSIS — N3000 Acute cystitis without hematuria: Secondary | ICD-10-CM | POA: Diagnosis not present

## 2017-01-23 DIAGNOSIS — R1084 Generalized abdominal pain: Secondary | ICD-10-CM | POA: Diagnosis not present

## 2017-01-23 DIAGNOSIS — I10 Essential (primary) hypertension: Secondary | ICD-10-CM | POA: Diagnosis not present

## 2017-01-23 DIAGNOSIS — R300 Dysuria: Secondary | ICD-10-CM | POA: Diagnosis not present

## 2017-01-25 DIAGNOSIS — R1084 Generalized abdominal pain: Secondary | ICD-10-CM | POA: Diagnosis not present

## 2017-01-25 DIAGNOSIS — I10 Essential (primary) hypertension: Secondary | ICD-10-CM | POA: Diagnosis not present

## 2017-01-25 DIAGNOSIS — N183 Chronic kidney disease, stage 3 (moderate): Secondary | ICD-10-CM | POA: Diagnosis not present

## 2017-01-25 DIAGNOSIS — E7800 Pure hypercholesterolemia, unspecified: Secondary | ICD-10-CM | POA: Diagnosis not present

## 2017-04-10 DIAGNOSIS — Z23 Encounter for immunization: Secondary | ICD-10-CM | POA: Diagnosis not present

## 2017-06-22 ENCOUNTER — Other Ambulatory Visit: Payer: Self-pay

## 2017-06-22 ENCOUNTER — Emergency Department (HOSPITAL_COMMUNITY): Payer: Medicare Other

## 2017-06-22 ENCOUNTER — Encounter (HOSPITAL_COMMUNITY): Payer: Self-pay | Admitting: Emergency Medicine

## 2017-06-22 ENCOUNTER — Emergency Department (HOSPITAL_COMMUNITY)
Admission: EM | Admit: 2017-06-22 | Discharge: 2017-06-22 | Disposition: A | Discharge status: Home / Self Care | Payer: Medicare Other | Attending: Emergency Medicine | Admitting: Emergency Medicine

## 2017-06-22 DIAGNOSIS — Y93H9 Activity, other involving exterior property and land maintenance, building and construction: Secondary | ICD-10-CM | POA: Diagnosis not present

## 2017-06-22 DIAGNOSIS — Y92017 Garden or yard in single-family (private) house as the place of occurrence of the external cause: Secondary | ICD-10-CM | POA: Insufficient documentation

## 2017-06-22 DIAGNOSIS — X500XXA Overexertion from strenuous movement or load, initial encounter: Secondary | ICD-10-CM | POA: Diagnosis not present

## 2017-06-22 DIAGNOSIS — S32040A Wedge compression fracture of fourth lumbar vertebra, initial encounter for closed fracture: Secondary | ICD-10-CM | POA: Diagnosis not present

## 2017-06-22 DIAGNOSIS — Z79899 Other long term (current) drug therapy: Secondary | ICD-10-CM | POA: Diagnosis not present

## 2017-06-22 DIAGNOSIS — S299XXA Unspecified injury of thorax, initial encounter: Secondary | ICD-10-CM | POA: Diagnosis present

## 2017-06-22 DIAGNOSIS — I10 Essential (primary) hypertension: Secondary | ICD-10-CM | POA: Diagnosis not present

## 2017-06-22 DIAGNOSIS — M545 Low back pain: Secondary | ICD-10-CM | POA: Diagnosis not present

## 2017-06-22 DIAGNOSIS — Y999 Unspecified external cause status: Secondary | ICD-10-CM | POA: Diagnosis not present

## 2017-06-22 DIAGNOSIS — Z7982 Long term (current) use of aspirin: Secondary | ICD-10-CM | POA: Diagnosis not present

## 2017-06-22 MED ORDER — MECLIZINE HCL 25 MG PO TABS
25.0000 mg | ORAL_TABLET | Freq: Once | ORAL | Status: AC
  Administered 2017-06-22: 25 mg via ORAL
  Filled 2017-06-22: qty 1

## 2017-06-22 MED ORDER — TRAMADOL HCL 50 MG PO TABS: 50.0000 mg | ORAL_TABLET | Freq: Four times a day (QID) | ORAL | 0 refills | Status: DC | PRN

## 2017-06-22 NOTE — ED Notes (Signed)
Pt returned from CT °

## 2017-06-22 NOTE — ED Notes (Signed)
ED Provider at bedside. 

## 2017-06-22 NOTE — ED Notes (Signed)
Patient transported to CT 

## 2017-06-22 NOTE — ED Provider Notes (Signed)
[facility] Provider Note   CSN: [id number] Arrival date & time: 06/22/17  1106     History   Chief Complaint Chief Complaint  Patient presents with  . Back Pain    HPI Rebecca Bradshaw is a 84 y.o. female.  Patient with complaint of low back pain.  Patient was moving a trash can and felt a pop in her low back.  Since that time she has had pain there.  She has some chronic pain to both legs nothing new or worse since this event.  No weakness no incontinence.  No fall.  No syncope.  This occurred on Wednesday.      Past Medical History:  Diagnosis Date  . A-fib (HCC)   . Arrhythmia 2014   chronic/permanent atrial fibrillation -rate control with carvedilol and anticoag wth warfarin  . Chronic diarrhea   . Essential hypertension 06/30/2013  . History of diverticulitis   . Hyperlipidemia    on statin therapy    Patient Active Problem List   Diagnosis Date Noted  . Fall 09/19/2014  . Essential hypertension 06/30/2013  . Hyperlipidemia 06/30/2013  . Frequency of micturition 06/30/2013  . Atrial fibrillation (HCC) 08/28/2012  . Long-term (current) use of anticoagulants 08/28/2012    Past Surgical History:  Procedure Laterality Date  . abdominal  aorta doppler  10/17/2007   normal taper--normal duplex  . DOPPLER ECHOCARDIOGRAPHY  10/17/2007   EF=>55%; -LA -moderated diltaed; RA-moderately; borderline mitral valve prolapse of AMVL- moderate mitral regurg.; moderatetricuspid regurg.--rgt ventricular systolic pressure elevated 30-40 mmHg  . NM MYOVIEW LTD  06/21/2011   EF 78%  LV systolic function normal - NORMAL STUDY  . TRANSESOPHAGEAL ECHOCARDIOGRAM  12/10/2007   normal left ventricle ; normal aortic valve there was trivial  valvular regurg; left atium markedly dilated no evidence for atrial thrombus,; mild to moderate tricuspid valvular regurg. , right atrium markedly dilated    OB History    No data available       Home  Medications    Prior to Admission medications   Medication Sig Start Date End Date Taking? Authorizing Provider  aspirin 81 MG tablet Take 1 tablet (81 mg total) by mouth daily. 07/05/15   Croitoru, Mihai, MD  calcium-vitamin D (OSCAL WITH D) 500-200 MG-UNIT per tablet Take 1 tablet by mouth daily.    [provider]  Carboxymethylcellul-Glycerin (OPTIVE) 0.5-0.9 % SOLN Apply 1 drop to eye daily as needed.    [provider]  carvedilol (COREG) 12.5 MG tablet Take 12.5 mg by mouth 2 (two) times daily with a meal.    [provider]  cholecalciferol (VITAMIN D) 1000 UNITS tablet Take 1,000 Units by mouth daily.    [provider]  diazepam (VALIUM) 10 MG tablet Take 1 tablet by mouth daily. Half in the morning and half at night 11/19/14   [provider]  fluorometholone (FML) 0.1 % ophthalmic suspension Place 1 drop into both eyes 2 (two) times daily. 06/24/15   [provider]  meclizine (ANTIVERT) 25 MG tablet Take 25 mg by mouth 3 (three) times daily as needed for dizziness.    [provider]  pravastatin (PRAVACHOL) 40 MG tablet Take 40 mg by mouth daily.    [provider]  traMADol (ULTRAM) 50 MG tablet Take 1 tablet (50 mg total) by mouth every 6 (six) hours as needed. 06/22/17   Vanetta MuldersZackowski, Wenzel Backlund, MD    Family History Family History  Problem  Relation Age of Onset  . Heart disease Mother   . Heart attack Father   . Diabetes Sister   . Cancer Brother     Social History Social History   Tobacco Use  . Smoking status: Never Smoker  . Smokeless tobacco: Never Used  Substance Use Topics  . Alcohol use: No  . Drug use: No     Allergies   Codeine; Metoprolol; Morphine and related; and Pantoprazole   Review of Systems Review of Systems  Constitutional: Negative for fever.  HENT: Negative for congestion.   Eyes: Negative for redness.  Respiratory: Negative for shortness of breath.   Cardiovascular:  Negative for chest pain.  Gastrointestinal: Negative for abdominal pain.  Genitourinary: Negative for difficulty urinating and dysuria.  Musculoskeletal: Positive for back pain. Negative for neck pain.  Neurological: Negative for weakness, numbness and headaches.  Hematological: Does not bruise/bleed easily.  Psychiatric/Behavioral: Negative for confusion.     Physical Exam Updated Vital Signs BP (!) 169/87   Pulse 89   Temp 99 F (37.2 C) (Oral)   Resp 18   SpO2 99%   Physical Exam  Constitutional: She is oriented to person, place, and time. She appears well-developed and well-nourished. No distress.  HENT:  Head: Normocephalic and atraumatic.  Mouth/Throat: Oropharynx is clear and moist.  Eyes: Conjunctivae and EOM are normal. Pupils are equal, round, and reactive to light.  Neck: Normal range of motion. Neck supple.  Cardiovascular: Normal rate, regular rhythm and normal heart sounds.  Pulmonary/Chest: Effort normal and breath sounds normal.  Abdominal: Soft. Bowel sounds are normal. There is no tenderness.  Musculoskeletal: Normal range of motion.  Neurological: She is alert and oriented to person, place, and time. No cranial nerve deficit. She exhibits normal muscle tone. Coordination normal.  Skin: Skin is warm.  Nursing note and vitals reviewed.    ED Treatments / Results  Labs (all labs ordered are listed, but only abnormal results are displayed) Labs Reviewed - No data to display  EKG  EKG Interpretation  Date/Time:  Friday June 22 2017 11:21:02 EST Ventricular Rate:  98 PR Interval:    QRS Duration: 66 QT Interval:  344 QTC Calculation: 439 R Axis:   27 Text Interpretation:  Atrial fibrillation Septal infarct , age undetermined Abnormal ECG Confirmed by Vanetta Mulders [phone]) on 06/22/2017 4:12:57 PM       Radiology Ct Lumbar Spine Wo Contrast  Result Date: 06/22/2017 CLINICAL DATA:  84 y/o  F; felt a pop in lower back with pain. EXAM: CT  LUMBAR SPINE WITHOUT CONTRAST TECHNIQUE: Multidetector CT imaging of the lumbar spine was performed without intravenous contrast administration. Multiplanar CT image reconstructions were also generated. COMPARISON:  04/07/2014 CT abdomen and pelvis FINDINGS: Segmentation: 5 lumbar type vertebrae. Alignment: Mild lumbar levocurvature.  Normal lumbar lordosis. Vertebrae: New L4 superior endplate fracture with 30% loss of vertebral body height and persistent fracture line. 5 mm bony retropulsion of superior endplate resulting in mild-to-moderate canal stenosis. Stable L2 anterior compression deformity with 60% loss of height and 6 mm bony retropulsion of endplate. No additional fracture identified. Paraspinal and other soft tissues: Calcific atherosclerosis of the abdominal aorta. Disc levels: Multilevel small disc bulges and facet arthropathy. No high-grade foraminal stenosis. IMPRESSION: 1. Interval probably acute L4 superior endplate fracture with 30% loss of vertebral body height. 2. L4 superior endplate retropulsion and L3-4 degenerative changes with mild-to-moderate L3-4 level canal stenosis. 3. Stable moderate chronic L2 anterior compression deformity. 4. No additional  fracture identified.  No malalignment. Electronically Signed   By: Mitzi Hansen M.D.   On: 06/22/2017 17:51    Procedures Procedures (including critical care time)  Medications Ordered in ED Medications  meclizine (ANTIVERT) tablet 25 mg (25 mg Oral Given 06/22/17 1632)     Initial Impression / Assessment and Plan / ED Course  I have reviewed the triage vital signs and the nursing notes.  Pertinent labs & imaging results that were available during my care of the patient were reviewed by me and considered in my medical decision making (see chart for details).     CT lumbar shows evidence of a 30% compression fracture of the L4 vertebrae.  There is evidence of a little bit of 5 mm retropulsion into the spinal canal.   Patient here without any neuro deficits.  Patient is nervous about taking tramadol her cardiologist limits what meds she can take.  So I gave her prescription for tramadol she will call her cardiologist on Monday to make sure it is okay.  She is followed by [facility] orthopedics and she will give them a call for follow-up on Monday. Final Clinical Impressions(s) / ED Diagnoses   Final diagnoses:  Closed compression fracture of fourth lumbar vertebra, initial encounter [facility])    ED Discharge Orders        Ordered    traMADol (ULTRAM) 50 MG tablet  Every 6 hours PRN     06/22/17 1940       Vanetta Mulders, MD 06/22/17 2015

## 2017-06-22 NOTE — Discharge Instructions (Signed)
[facility] provided.  Take Tylenol or the tramadol as needed for pain.  If you feel more comfortable check with your cardiologist on Monday about tramadol being okay with your atrial fib.  Return for any new or worse symptoms.  CT scan of the lumbar back shows a 30% compression fracture of your L4 vertebrae.  Return for any incontinence significant weakness in both legs.  This can be a sign of some pressure on the spinal cord.  For this she need to be seen right away.  [facility].

## 2017-06-22 NOTE — ED Triage Notes (Signed)
Pt states, "I was pulling my garbage can up the walkway on Wednesday and heard a pop in my back.  I started hurting all through my sides. I can hardly lift my legs now." Pulses intact, pt denies loss of control of bowel/bladder.Pt denies pain with resp. NAD noted at this time, resp e/u.

## 2017-06-28 DIAGNOSIS — S32001A Stable burst fracture of unspecified lumbar vertebra, initial encounter for closed fracture: Secondary | ICD-10-CM | POA: Diagnosis not present

## 2017-06-28 DIAGNOSIS — M4856XA Collapsed vertebra, not elsewhere classified, lumbar region, initial encounter for fracture: Secondary | ICD-10-CM | POA: Diagnosis not present

## 2017-07-04 ENCOUNTER — Telehealth: Payer: Self-pay | Admitting: Cardiovascular Disease

## 2017-07-04 NOTE — Telephone Encounter (Signed)
Tramadol will be fine with her current medication list

## 2017-07-04 NOTE — Telephone Encounter (Signed)
Patient made aware and verbalized understanding.

## 2017-07-04 NOTE — Telephone Encounter (Signed)
Pt hurt her back amd her doctor put her on Tramadol.He wants her to check and see if this medicine is alright for her to take with her other medicine?

## 2017-07-04 NOTE — Telephone Encounter (Signed)
Routed to PharmD to review

## 2017-07-10 DIAGNOSIS — H353131 Nonexudative age-related macular degeneration, bilateral, early dry stage: Secondary | ICD-10-CM | POA: Diagnosis not present

## 2017-07-10 DIAGNOSIS — E119 Type 2 diabetes mellitus without complications: Secondary | ICD-10-CM | POA: Diagnosis not present

## 2017-07-10 DIAGNOSIS — H40013 Open angle with borderline findings, low risk, bilateral: Secondary | ICD-10-CM | POA: Diagnosis not present

## 2017-07-10 DIAGNOSIS — Z961 Presence of intraocular lens: Secondary | ICD-10-CM | POA: Diagnosis not present

## 2017-07-12 DIAGNOSIS — S32001A Stable burst fracture of unspecified lumbar vertebra, initial encounter for closed fracture: Secondary | ICD-10-CM | POA: Diagnosis not present

## 2017-07-26 ENCOUNTER — Encounter (HOSPITAL_COMMUNITY): Payer: Self-pay

## 2017-07-26 ENCOUNTER — Other Ambulatory Visit: Payer: Self-pay

## 2017-07-26 ENCOUNTER — Emergency Department (HOSPITAL_COMMUNITY)
Admission: EM | Admit: 2017-07-26 | Discharge: 2017-07-26 | Disposition: A | Discharge status: Home / Self Care | Payer: Medicare Other | Attending: Emergency Medicine | Admitting: Emergency Medicine

## 2017-07-26 DIAGNOSIS — Z7901 Long term (current) use of anticoagulants: Secondary | ICD-10-CM | POA: Insufficient documentation

## 2017-07-26 DIAGNOSIS — I4891 Unspecified atrial fibrillation: Secondary | ICD-10-CM | POA: Diagnosis not present

## 2017-07-26 DIAGNOSIS — Z7982 Long term (current) use of aspirin: Secondary | ICD-10-CM | POA: Insufficient documentation

## 2017-07-26 DIAGNOSIS — Z79899 Other long term (current) drug therapy: Secondary | ICD-10-CM | POA: Insufficient documentation

## 2017-07-26 DIAGNOSIS — R0989 Other specified symptoms and signs involving the circulatory and respiratory systems: Secondary | ICD-10-CM | POA: Insufficient documentation

## 2017-07-26 DIAGNOSIS — E7800 Pure hypercholesterolemia, unspecified: Secondary | ICD-10-CM | POA: Diagnosis not present

## 2017-07-26 DIAGNOSIS — I10 Essential (primary) hypertension: Secondary | ICD-10-CM | POA: Diagnosis not present

## 2017-07-26 DIAGNOSIS — R1013 Epigastric pain: Secondary | ICD-10-CM | POA: Diagnosis not present

## 2017-07-26 DIAGNOSIS — R1110 Vomiting, unspecified: Secondary | ICD-10-CM | POA: Diagnosis not present

## 2017-07-26 DIAGNOSIS — N183 Chronic kidney disease, stage 3 (moderate): Secondary | ICD-10-CM | POA: Diagnosis not present

## 2017-07-26 DIAGNOSIS — F411 Generalized anxiety disorder: Secondary | ICD-10-CM | POA: Diagnosis not present

## 2017-07-26 MED ORDER — ONDANSETRON 4 MG PO TBDP: 4.0000 mg | ORAL_TABLET | Freq: Three times a day (TID) | ORAL | 0 refills | Status: AC | PRN

## 2017-07-26 MED ORDER — ONDANSETRON 4 MG PO TBDP
4.0000 mg | ORAL_TABLET | Freq: Once | ORAL | Status: AC
  Administered 2017-07-26: 4 mg via ORAL
  Filled 2017-07-26: qty 1

## 2017-07-26 NOTE — ED Provider Notes (Signed)
[facility]  CSN: [id number] Arrival date & time: 07/26/17 1317  Chief Complaint(s) Food Bolus  HPI Rebecca Bradshaw is a 84 y.o. female with a history of GERD who presents to the emergency department with foreign body sensation in the esophagus.  She states that she was eating lunch and felt that her hush puppy got stuck in the middle her esophagus.  She had several episodes of emesis and was able to get the food out however she still has foreign body sensation in her throat.  States that she drank a bit of water that has not come up yet. She is having some mild emesis of oral secretions.  No alleviating or aggravating factors.  She denies any shortness of breath, chest pain, nausea, headache, abdominal pain.    HPI  Past Medical History Past Medical History:  Diagnosis Date  . A-fib (HCC)   . Arrhythmia 2014   chronic/permanent atrial fibrillation -rate control with carvedilol and anticoag wth warfarin  . Chronic diarrhea   . Essential hypertension 06/30/2013  . History of diverticulitis   . Hyperlipidemia    on statin therapy   Patient Active Problem List   Diagnosis Date Noted  . Fall 09/19/2014  . Essential hypertension 06/30/2013  . Hyperlipidemia 06/30/2013  . Frequency of micturition 06/30/2013  . Atrial fibrillation (HCC) 08/28/2012  . Long-term (current) use of anticoagulants 08/28/2012   Home Medication(s) Prior to Admission medications   Medication Sig Start Date End Date Taking? Authorizing Provider  acetaminophen (TYLENOL) 325 MG tablet Take 650 mg by mouth daily as needed for mild pain.   Yes [provider]  aspirin 81 MG tablet Take 1 tablet (81 mg total) by mouth daily. 07/05/15  Yes Croitoru, Mihai, MD  calcium-vitamin D (OSCAL WITH D) 500-200 MG-UNIT per tablet Take 1 tablet by mouth daily.   Yes [provider]  carvedilol (COREG) 12.5 MG tablet Take 12.5 mg by mouth 2 (two) times daily with  a meal.   Yes [provider]  Cholecalciferol (VITAMIN D PO) Take 1 Units by mouth daily.    Yes [provider]  diazepam (VALIUM) 5 MG tablet Take 2.5 mg by mouth 2 (two) times daily. 2.5 MG IN THE MORNING AND 5 MG IN THE EVENING 11/19/14  Yes [provider]  Polyvinyl Alcohol-Povidone (REFRESH OP) Place 1 drop into both eyes 2 (two) times daily as needed (DRY EYES).   Yes [provider]  pravastatin (PRAVACHOL) 40 MG tablet Take 40 mg by mouth daily.   Yes [provider]  senna-docusate (COLACE 2-IN-1) 8.6-50 MG tablet Take 1 tablet by mouth at bedtime.   Yes [provider]  traMADol (ULTRAM) 50 MG tablet Take 1 tablet (50 mg total) by mouth every 6 (six) hours as needed. Patient taking differently: Take 50 mg by mouth every 6 (six) hours as needed for moderate pain.  06/22/17  Yes Vanetta Mulders, MD  meclizine (ANTIVERT) 25 MG tablet Take 25 mg by mouth 3 (three) times daily as needed for dizziness.    [provider]  ondansetron (ZOFRAN ODT) 4 MG disintegrating tablet Take 1 tablet (4 mg total) by mouth every 8 (eight) hours as needed for up to 3 days for nausea or vomiting. 07/26/17 07/29/17  Lavere Shinsky, Amadeo Garnet, MD  Past Surgical History Past Surgical History:  Procedure Laterality Date  . abdominal  aorta doppler  10/17/2007   normal taper--normal duplex  . DOPPLER ECHOCARDIOGRAPHY  10/17/2007   EF=>55%; -LA -moderated diltaed; RA-moderately; borderline mitral valve prolapse of AMVL- moderate mitral regurg.; moderatetricuspid regurg.--rgt ventricular systolic pressure elevated 30-40 mmHg  . NM MYOVIEW LTD  06/21/2011   EF 78%  LV systolic function normal - NORMAL STUDY  . TRANSESOPHAGEAL ECHOCARDIOGRAM  12/10/2007   normal left ventricle ; normal aortic valve there was trivial  valvular  regurg; left atium markedly dilated no evidence for atrial thrombus,; mild to moderate tricuspid valvular regurg. , right atrium markedly dilated   Family History Family History  Problem Relation Age of Onset  . Heart disease Mother   . Heart attack Father   . Diabetes Sister   . Cancer Brother     Social History Social History   Tobacco Use  . Smoking status: Never Smoker  . Smokeless tobacco: Never Used  Substance Use Topics  . Alcohol use: No  . Drug use: No   Allergies Codeine; Metoprolol; Morphine and related; and Pantoprazole  Review of Systems Review of Systems All other systems are reviewed and are negative for acute change except as noted in the HPI  Physical Exam Vital Signs  I have reviewed the triage vital signs BP (!) 179/86   Pulse (!) 115   Temp 98 F (36.7 C) (Oral)   SpO2 98%   Physical Exam  Constitutional: She is oriented to person, place, and time. She appears well-developed and well-nourished. No distress.  HENT:  Head: Normocephalic and atraumatic.  Nose: Nose normal.  Eyes: Conjunctivae and EOM are normal. Pupils are equal, round, and reactive to light. Right eye exhibits no discharge. Left eye exhibits no discharge. No scleral icterus.  Neck: Normal range of motion. Neck supple.  Cardiovascular: Normal rate and regular rhythm. Exam reveals no gallop and no friction rub.  No murmur heard. Pulmonary/Chest: Effort normal and breath sounds normal. No stridor. No respiratory distress. She has no rales.  Abdominal: Soft. She exhibits no distension. There is no tenderness.  Musculoskeletal: She exhibits no edema or tenderness.  Neurological: She is alert and oriented to person, place, and time.  Skin: Skin is warm and dry. No rash noted. She is not diaphoretic. No erythema.  Psychiatric: She has a normal mood and affect.  Vitals reviewed.   ED Results and Treatments Labs (all labs ordered are listed, but only abnormal results are  displayed) Labs Reviewed - No data to display                                                                                                                       EKG  EKG Interpretation  Date/Time:    Ventricular Rate:    PR Interval:    QRS Duration:   QT Interval:    QTC Calculation:   R Axis:     Text Interpretation:  Radiology No results found. Pertinent labs & imaging results that were available during my care of the patient were reviewed by me and considered in my medical decision making (see chart for details).  Medications Ordered in ED Medications  ondansetron (ZOFRAN-ODT) disintegrating tablet 4 mg (4 mg Oral Given 07/26/17 1408)                                                                                                                                    Procedures Procedures  (including critical care time)  Medical Decision Making / ED Course I have reviewed the nursing notes for this encounter and the patient's prior records (if available in EHR or on provided paperwork).    Foreign body sensation in the esophagus.  No respiratory distress.  Patient did have a couple of episodes of mild mucus emesis.  She was given Zofran and Coca-Cola.  She was monitored for several hours without recurrence of emesis.  She was able to tolerate oral intake.  Recommended mechanical soft diet and GI follow-up.  The patient appears reasonably screened and/or stabilized for discharge and I doubt any other medical condition or other [facility] requiring further screening, evaluation, or treatment in the ED at this time prior to discharge.  The patient is safe for discharge with strict return precautions.   Final Clinical Impression(s) / ED Diagnoses Final diagnoses:  Foreign body sensation in throat    Disposition: Discharge  Condition: Good  I have discussed the results, Dx and Tx plan with the patient who expressed understanding and agree(s) with the plan. Discharge  instructions discussed at great length. The patient was given strict return precautions who verbalized understanding of the instructions. No further questions at time of discharge.    ED Discharge Orders        Ordered    ondansetron (ZOFRAN ODT) 4 MG disintegrating tablet  Every 8 hours PRN     07/26/17 1556       Follow Up: Johny Blamer, MD [address] [place] [address] [phone]  Schedule an appointment as soon as possible for a visit  As needed  Gastroenterology, Deboraha Sprang [address] [place] [address] [phone]  Schedule an appointment as soon as possible for a visit  For close follow up to assess for food lodging in throat     This chart was dictated using voice recognition software.  Despite best efforts to proofread,  errors can occur which can change the documentation meaning.   Nira Conn, MD 07/26/17 [phone]

## 2017-07-26 NOTE — ED Triage Notes (Signed)
Pt reports choking on a hushpuppy at lunch. She is spitting into a emesis bag in triage. Reports epigastric pain. Lung sounds clear, but voice is clear. Tremors at baseline.

## 2017-07-26 NOTE — ED Notes (Signed)
Patient sipping coca cola via straw without nausea/vomiting/drooling. Patient tolerating well. Patient states "that lump in my throat doesn't feel like its there anymore."

## 2017-08-02 DIAGNOSIS — S32048A Other fracture of fourth lumbar vertebra, initial encounter for closed fracture: Secondary | ICD-10-CM | POA: Diagnosis not present

## 2017-08-07 ENCOUNTER — Telehealth: Payer: Self-pay | Admitting: Cardiovascular Disease

## 2017-08-07 NOTE — Telephone Encounter (Signed)
Returned call to patient. Advised that she eat and take meds as directed for her March 1 appt. She had lab work done on 07/26/17 by PCP but last lipids were August 2018. Will route to CMA if other recommendations are needed

## 2017-08-07 NOTE — Telephone Encounter (Signed)
New Message:    Pt has an appointment with Dr C on 08-10-17.She wants to know if she can eat and take her medicine before her appointment?

## 2017-08-09 DIAGNOSIS — R1314 Dysphagia, pharyngoesophageal phase: Secondary | ICD-10-CM | POA: Diagnosis not present

## 2017-08-09 DIAGNOSIS — I4891 Unspecified atrial fibrillation: Secondary | ICD-10-CM | POA: Diagnosis not present

## 2017-08-10 ENCOUNTER — Encounter: Payer: Self-pay | Admitting: Cardiovascular Disease

## 2017-08-10 ENCOUNTER — Ambulatory Visit: Payer: Medicare Other | Admitting: Cardiovascular Disease

## 2017-08-10 ENCOUNTER — Other Ambulatory Visit (HOSPITAL_COMMUNITY): Payer: Self-pay | Admitting: Gastroenterology

## 2017-08-10 VITALS — BP 140/86 | HR 91 | Ht 66.0 in | Wt 134.0 lb

## 2017-08-10 DIAGNOSIS — I10 Essential (primary) hypertension: Secondary | ICD-10-CM

## 2017-08-10 DIAGNOSIS — E7800 Pure hypercholesterolemia, unspecified: Secondary | ICD-10-CM | POA: Diagnosis not present

## 2017-08-10 DIAGNOSIS — R1310 Dysphagia, unspecified: Secondary | ICD-10-CM

## 2017-08-10 DIAGNOSIS — I482 Chronic atrial fibrillation: Secondary | ICD-10-CM

## 2017-08-10 DIAGNOSIS — I4821 Permanent atrial fibrillation: Secondary | ICD-10-CM

## 2017-08-10 NOTE — Patient Instructions (Signed)
Dr Croitoru recommends that you schedule a follow-up appointment in 12 months. You will receive a reminder letter in the mail two months in advance. If you don't receive a letter, please call our office to schedule the follow-up appointment.  If you need a refill on your cardiac medications before your next appointment, please call your pharmacy. 

## 2017-08-10 NOTE — Progress Notes (Signed)
Patient ID: Rebecca Bradshaw, female   DOB: 12/06/1930, 84 y.o.   MRN: [id number]    Cardiology Office Note    Date:  08/10/2017   ID:  Rebecca Bradshaw, DOB [birth year], MRN [id number]  PCP:  Rebecca Blamer, MD  Cardiologist:   Rebecca Fair, MD   Chief complaint: back pain   History of Present Illness:  Rebecca Bradshaw is a 84 y.o. female with permanent atrial fibrillation and HTN. Although she is still living independently, she remains quite unsteady and prone to falls. For this reason, she is not on anticoagulation any longer.   Since her last appointment she has not had any falls, serious injuries or bleeding.  She does have a vertebral compression fracture which happened simply when she was pulling the trash can did occur.  She has had a lot of problems with back pain radiating across the intercostal space to the mid abdominal area and is wearing a back brace.  The patient specifically denies any chest pain at rest exertion, dyspnea at rest or with exertion, orthopnea, paroxysmal nocturnal dyspnea, syncope, palpitations, focal neurological deficits, intermittent claudication, lower extremity edema, unexplained weight gain, cough, hemoptysis or wheezing.  By previous echocardiography she has borderline mitral valve prolapse with moderate mitral regurgitation and a moderate biatrial dilatation. She has normal left ventricular systolic function and had a normal myocardial perfusion study several years ago.  Past Medical History:  Diagnosis Date  . A-fib (HCC)   . Arrhythmia 2014   chronic/permanent atrial fibrillation -rate control with carvedilol and anticoag wth warfarin  . Chronic diarrhea   . Essential hypertension 06/30/2013  . History of diverticulitis   . Hyperlipidemia    on statin therapy    Past Surgical History:  Procedure Laterality Date  . abdominal  aorta doppler  10/17/2007   normal taper--normal duplex  . DOPPLER ECHOCARDIOGRAPHY  10/17/2007   EF=>55%; -LA  -moderated diltaed; RA-moderately; borderline mitral valve prolapse of AMVL- moderate mitral regurg.; moderatetricuspid regurg.--rgt ventricular systolic pressure elevated 30-40 mmHg  . NM MYOVIEW LTD  06/21/2011   EF 78%  LV systolic function normal - NORMAL STUDY  . TRANSESOPHAGEAL ECHOCARDIOGRAM  12/10/2007   normal left ventricle ; normal aortic valve there was trivial  valvular regurg; left atium markedly dilated no evidence for atrial thrombus,; mild to moderate tricuspid valvular regurg. , right atrium markedly dilated    Outpatient Medications Prior to Visit  Medication Sig Dispense Refill  . acetaminophen (TYLENOL) 325 MG tablet Take 650 mg by mouth daily as needed for mild pain.    Marland Kitchen aspirin 81 MG tablet Take 1 tablet (81 mg total) by mouth daily. 30 tablet 6  . calcium-vitamin D (OSCAL WITH D) 500-200 MG-UNIT per tablet Take 1 tablet by mouth daily.    . carvedilol (COREG) 12.5 MG tablet Take 12.5 mg by mouth 2 (two) times daily with a meal.    . Cholecalciferol (VITAMIN D PO) Take 1 Units by mouth daily.     . diazepam (VALIUM) 5 MG tablet Take 2.5 mg by mouth 2 (two) times daily. Take 0.5 tablet (2.5 mg total) by mouth every morning and take 1 tablet (5 mg total) by mouth every evening.  0  . meclizine (ANTIVERT) 25 MG tablet Take 25 mg by mouth 3 (three) times daily as needed for dizziness.    . ondansetron (ZOFRAN-ODT) 4 MG disintegrating tablet Take 1 tablet by mouth every 8 (eight) hours as needed.    . Polyvinyl Alcohol-Povidone (  REFRESH OP) Place 1 drop into both eyes 2 (two) times daily as needed (DRY EYES).    . pravastatin (PRAVACHOL) 40 MG tablet Take 40 mg by mouth daily.    Marland Kitchen. senna-docusate (COLACE 2-IN-1) 8.6-50 MG tablet Take 1 tablet by mouth at bedtime.    . traMADol (ULTRAM) 50 MG tablet Take 1 tablet (50 mg total) by mouth every 6 (six) hours as needed. 15 tablet 0   No facility-administered medications prior to visit.      Allergies:   Codeine; Metoprolol;  Morphine and related; and Pantoprazole   Social History   Socioeconomic History  . Marital status: Widowed    Spouse name: None  . Number of children: None  . Years of education: None  . Highest education level: None  Social Needs  . Financial resource strain: None  . Food insecurity - worry: None  . Food insecurity - inability: None  . Transportation needs - medical: None  . Transportation needs - non-medical: None  Occupational History  . None  Tobacco Use  . Smoking status: Never Smoker  . Smokeless tobacco: Never Used  Substance and Sexual Activity  . Alcohol use: No  . Drug use: No  . Sexual activity: None  Other Topics Concern  . None  Social History Narrative  . None     Family History:  The patient's family history includes Cancer in her brother; Diabetes in her sister; Heart attack in her father; Heart disease in her mother.   ROS:   Please see the history of present illness.    ROS All other systems reviewed and are negative.   PHYSICAL EXAM:   VS:  BP 140/86   Pulse 91   Ht 5\' 6"  (1.676 m)   Wt 134 lb (60.8 kg)   SpO2 97%   BMI 21.63 kg/m     General: Alert, oriented x3, no distress, she is elderly, very thin and frail appearing Head: no evidence of trauma, PERRL, EOMI, no exophtalmos or lid lag, no myxedema, no xanthelasma; normal ears, nose and oropharynx Neck: normal jugular venous pulsations and no hepatojugular reflux; brisk carotid pulses without delay and no carotid bruits Chest: clear to auscultation, no signs of consolidation by percussion or palpation, normal fremitus, symmetrical and full respiratory excursions Cardiovascular: normal position and quality of the apical impulse, irregular rhythm, normal first and second heart sounds, apical holosystolic 2/6 murmur, no diastolic murmurs, rubs or gallops Abdomen: no tenderness or distention, no masses by palpation, no abnormal pulsatility or arterial bruits, normal bowel sounds, no  hepatosplenomegaly Extremities: no clubbing, cyanosis or edema; 2+ radial, ulnar and brachial pulses bilaterally; 2+ right femoral, posterior tibial and dorsalis pedis pulses; 2+ left femoral, posterior tibial and dorsalis pedis pulses; no subclavian or femoral bruits Neurological: grossly nonfocal Psych: Normal mood and affect   Wt Readings from Last 3 Encounters:  08/10/17 134 lb (60.8 kg)  07/21/16 136 lb 6.4 oz (61.9 kg)  07/05/15 139 lb 3.2 oz (63.1 kg)      Studies/Labs Reviewed:   EKG:  EKG is ordered today.  The ekg ordered today demonstrates atrial fibrillation, low voltage limb leads, QS V1-V3, QTc 426 ms  LABS Normal liver function tests, creatinine 0.8, hemoglobin 13.2, glucose 102, potassium 4.3 Total cholesterol 120, HDL 60, LDL 50, triglycerides 52 ASSESSMENT:    1. Permanent atrial fibrillation (HCC)   2. Hypertension, essential   3. Hypercholesterolemia      PLAN:  In order of problems listed  above:  1. Permanent atrial fibrillation: Well rate controlled.  Continue aspirin for stroke prevention. Intermediate embolic risk CHADSVasc 4(age 44, gender, hypertension), but felt to be high risk for injury and bleeding. 2. HTN: Bradshaw blood pressure control 3. HLP: On statin with excellent recent lipid profile 4. Osteoporosis/vertebral compression fracture   Medication Adjustments/Labs and Tests Ordered: Current medicines are reviewed at length with the patient today.  Concerns regarding medicines are outlined above.  Medication changes, Labs and Tests ordered today are listed in the Patient Instructions below. Patient Instructions  Dr Royann Shivers recommends that you schedule a follow-up appointment in 12 months. You will receive a reminder letter in the mail two months in advance. If you don't receive a letter, please call our office to schedule the follow-up appointment.  If you need a refill on your cardiac medications before your next appointment, please call your  pharmacy. Signed, Rebecca Fair, MD  08/10/2017 12:13 PM    [facility] [address] [place], [place], [place]  [address] Phone: [phone]; Fax: [phone]

## 2017-08-16 ENCOUNTER — Ambulatory Visit (HOSPITAL_COMMUNITY)
Admission: RE | Admit: 2017-08-16 | Discharge: 2017-08-16 | Disposition: A | Discharge status: Home / Self Care | Payer: Medicare Other | Source: Ambulatory Visit | Attending: Gastroenterology | Admitting: Gastroenterology

## 2017-08-16 DIAGNOSIS — R1312 Dysphagia, oropharyngeal phase: Secondary | ICD-10-CM | POA: Insufficient documentation

## 2017-08-16 DIAGNOSIS — R1314 Dysphagia, pharyngoesophageal phase: Secondary | ICD-10-CM | POA: Diagnosis not present

## 2017-08-16 DIAGNOSIS — R1310 Dysphagia, unspecified: Secondary | ICD-10-CM | POA: Diagnosis not present

## 2017-08-16 DIAGNOSIS — R1313 Dysphagia, pharyngeal phase: Secondary | ICD-10-CM | POA: Diagnosis present

## 2017-08-16 NOTE — Progress Notes (Signed)
Modified Barium Swallow Progress Note  Patient Details  Name: Rebecca Bradshaw MRN: [id number] Date of Birth: May 18, 1931  Today's Date: 08/16/2017  Modified Barium Swallow completed.  Full report located under Chart Review in the Imaging Section.  Brief recommendations include the following:  Clinical Impression  Pt demonstrates a mild oral and oropharyngeal dyspahgia. There is an oral tremor that has been present many years per pt which may impact decreased lingual coordination for bolus manipulation, cohesion and transit leading to slow, piecemeal preparation. Pt is quite cautious with liquid intake, taking small sips, likeky due to incomplete vestibular closure prior and during the swallow. Pt is able to achieve adquate glottic closure however and only deep penetration with eventual ejection occurred as a second swallow is initiated to fully clear oral and vallecular residuals and eject penetrate. Esophageal sweep reveals diffuse esophageal stasis with solids. Pt should continue most regular solids, avoiding tough meats and dry breads and considering cutting food into small bites before eating. She was also advised to follow solids with liquids to faciliate esopahgeal clearance.    Swallow Evaluation Recommendations       SLP Diet Recommendations: Thin liquid;Dysphagia 3 (Mech soft) solids   Liquid Administration via: Cup;Straw   Medication Administration: Whole meds with liquid   Supervision: Patient able to self feed   Compensations: Multiple dry swallows after each bite/sip;Follow solids with liquid;Slow rate;Small sips/bites   Postural Changes: Seated upright at 90 degrees            Shakyra Mattera, Riley NearingBonnie Caroline 08/16/2017,2:21 PM

## 2017-08-28 DIAGNOSIS — R1313 Dysphagia, pharyngeal phase: Secondary | ICD-10-CM | POA: Diagnosis not present

## 2017-08-28 DIAGNOSIS — M545 Low back pain: Secondary | ICD-10-CM | POA: Diagnosis not present

## 2017-08-28 DIAGNOSIS — R251 Tremor, unspecified: Secondary | ICD-10-CM | POA: Diagnosis not present

## 2017-08-28 DIAGNOSIS — N183 Chronic kidney disease, stage 3 (moderate): Secondary | ICD-10-CM | POA: Diagnosis not present

## 2017-09-13 DIAGNOSIS — N183 Chronic kidney disease, stage 3 (moderate): Secondary | ICD-10-CM | POA: Diagnosis not present

## 2017-09-13 DIAGNOSIS — E7800 Pure hypercholesterolemia, unspecified: Secondary | ICD-10-CM | POA: Diagnosis not present

## 2017-09-13 DIAGNOSIS — S32001A Stable burst fracture of unspecified lumbar vertebra, initial encounter for closed fracture: Secondary | ICD-10-CM | POA: Diagnosis not present

## 2017-09-13 DIAGNOSIS — G250 Essential tremor: Secondary | ICD-10-CM | POA: Diagnosis not present

## 2017-09-13 DIAGNOSIS — R1313 Dysphagia, pharyngeal phase: Secondary | ICD-10-CM | POA: Diagnosis not present

## 2017-09-13 DIAGNOSIS — S32041D Stable burst fracture of fourth lumbar vertebra, subsequent encounter for fracture with routine healing: Secondary | ICD-10-CM | POA: Diagnosis not present

## 2017-09-13 DIAGNOSIS — Z7982 Long term (current) use of aspirin: Secondary | ICD-10-CM | POA: Diagnosis not present

## 2017-09-13 DIAGNOSIS — F411 Generalized anxiety disorder: Secondary | ICD-10-CM | POA: Diagnosis not present

## 2017-09-13 DIAGNOSIS — I129 Hypertensive chronic kidney disease with stage 1 through stage 4 chronic kidney disease, or unspecified chronic kidney disease: Secondary | ICD-10-CM | POA: Diagnosis not present

## 2017-09-13 DIAGNOSIS — I4891 Unspecified atrial fibrillation: Secondary | ICD-10-CM | POA: Diagnosis not present

## 2017-11-19 ENCOUNTER — Emergency Department (HOSPITAL_COMMUNITY)
Admission: EM | Admit: 2017-11-19 | Discharge: 2017-11-20 | Disposition: A | Discharge status: Home / Self Care | Payer: Medicare Other | Attending: Emergency Medicine | Admitting: Emergency Medicine

## 2017-11-19 ENCOUNTER — Other Ambulatory Visit: Payer: Self-pay

## 2017-11-19 ENCOUNTER — Encounter (HOSPITAL_COMMUNITY): Payer: Self-pay | Admitting: *Deleted

## 2017-11-19 DIAGNOSIS — I10 Essential (primary) hypertension: Secondary | ICD-10-CM | POA: Insufficient documentation

## 2017-11-19 DIAGNOSIS — M79604 Pain in right leg: Secondary | ICD-10-CM | POA: Diagnosis not present

## 2017-11-19 DIAGNOSIS — Z7982 Long term (current) use of aspirin: Secondary | ICD-10-CM | POA: Insufficient documentation

## 2017-11-19 DIAGNOSIS — I4891 Unspecified atrial fibrillation: Secondary | ICD-10-CM | POA: Diagnosis not present

## 2017-11-19 DIAGNOSIS — Z79899 Other long term (current) drug therapy: Secondary | ICD-10-CM | POA: Insufficient documentation

## 2017-11-19 DIAGNOSIS — Z7901 Long term (current) use of anticoagulants: Secondary | ICD-10-CM | POA: Diagnosis not present

## 2017-11-19 DIAGNOSIS — M7989 Other specified soft tissue disorders: Secondary | ICD-10-CM | POA: Diagnosis not present

## 2017-11-19 LAB — BASIC METABOLIC PANEL
ANION GAP: 7 (ref 5–15)
BUN: 22 mg/dL — ABNORMAL HIGH (ref 6–20)
CO2: 27 mmol/L (ref 22–32)
CREATININE: 1.07 mg/dL — AB (ref 0.44–1.00)
Calcium: 8.9 mg/dL (ref 8.9–10.3)
Chloride: 104 mmol/L (ref 101–111)
GFR, EST AFRICAN AMERICAN: 53 mL/min — AB (ref >60)
GFR, EST NON AFRICAN AMERICAN: 46 mL/min — AB (ref >60)
Glucose, Bld: 104 mg/dL — ABNORMAL HIGH (ref 65–99)
Potassium: 3.8 mmol/L (ref 3.5–5.1)
Sodium: 138 mmol/L (ref 135–145)

## 2017-11-19 LAB — CBC
HCT: 36.4 % (ref 36.0–46.0)
Hemoglobin: 11.7 g/dL — ABNORMAL LOW (ref 12.0–15.0)
MCH: 28 pg (ref 26.0–34.0)
MCHC: 32.1 g/dL (ref 30.0–36.0)
MCV: 87.1 fL (ref 78.0–100.0)
PLATELETS: 155 10*3/uL (ref 150–400)
RBC: 4.18 MIL/uL (ref 3.87–5.11)
RDW: 13.4 % (ref 11.5–15.5)
WBC: 5.2 10*3/uL (ref 4.0–10.5)

## 2017-11-19 NOTE — ED Triage Notes (Signed)
Pt went to her doctor today for swelling  And pain in the right lower leg. Sent here by PCP for eval of DVT

## 2017-11-19 NOTE — ED Notes (Signed)
Pt with hx of afib, denies sob or cp. [facility]

## 2017-11-19 NOTE — ED Provider Notes (Signed)
Patient placed in Quick Look pathway, seen and evaluated   Chief Complaint:   HPI:   Rebecca Bradshaw is a 84 y.o. female who presents to the ED with right lower leg swelling and pain. Patient  went to her doctor today and ent here by PCP for eval for possible  DVT. Patient reports A-Fib.  ROS: M/S: right lower leg pain and swelling Physical Exam:  BP (!) 161/100   Pulse 75   Temp 98.3 F (36.8 C) (Oral)   Resp 16   SpO2 99%    Gen: No distress  Neuro: Awake and Alert  Skin: Warm and dry  M/S: right calf pain, pedal pulse palpated    Initiation of care has begun. The patient has been counseled on the process, plan, and necessity for staying for the completion/evaluation, and the remainder of the medical screening examination    Janne Napoleoneese, Hope M, NP 11/19/17 2042    Derwood KaplanNanavati, Ankit, MD 11/21/17 [phone]

## 2017-11-20 ENCOUNTER — Emergency Department (HOSPITAL_BASED_OUTPATIENT_CLINIC_OR_DEPARTMENT_OTHER)
Admit: 2017-11-20 | Discharge: 2017-11-20 | Disposition: A | Discharge status: Home / Self Care | Payer: Medicare Other | Attending: Emergency Medicine | Admitting: Emergency Medicine

## 2017-11-20 DIAGNOSIS — M7989 Other specified soft tissue disorders: Secondary | ICD-10-CM | POA: Diagnosis not present

## 2017-11-20 NOTE — ED Provider Notes (Signed)
[facility] Provider Note   CSN: [id number] Arrival date & time: 11/19/17  1927     History   Chief Complaint Chief Complaint  Patient presents with  . Leg Swelling    HPI Rebecca BeetsLucy F Bradshaw is a 84 y.o. female.  84-year-old female history of A. fib not on anticoagulation here with 3 weeks of increased swelling lower extremities right greater than left and right leg pain from data ankle over the past week.  She saw the coverage for her's PCP yesterday who reportedly drew some blood work and then called her telling her she needed to come to the ED of her for evaluation of a DVT.  She is been here unfortunately since last night as there was no ultrasound available.  She denies any chest pain or shortness of breath.  Spent fever cough.  No recent change in her medicines no trauma.  No immobilizations.  The history is provided by the patient and a relative.  Leg Pain   This is a new problem. The current episode started more than 1 week ago. The problem occurs constantly. The problem has not changed since onset.The pain is present in the right lower leg. The quality of the pain is described as aching. The pain is moderate. Pertinent negatives include no numbness. The symptoms are aggravated by activity. She has tried rest for the symptoms. There has been no history of extremity trauma.    Past Medical History:  Diagnosis Date  . A-fib (HCC)   . Arrhythmia 2014   chronic/permanent atrial fibrillation -rate control with carvedilol and anticoag wth warfarin  . Chronic diarrhea   . Essential hypertension 06/30/2013  . History of diverticulitis   . Hyperlipidemia    on statin therapy    Patient Active Problem List   Diagnosis Date Noted  . Fall 09/19/2014  . Essential hypertension 06/30/2013  . Hyperlipidemia 06/30/2013  . Frequency of micturition 06/30/2013  . Atrial fibrillation (HCC) 08/28/2012  . Long-term (current) use of anticoagulants  08/28/2012    Past Surgical History:  Procedure Laterality Date  . abdominal  aorta doppler  10/17/2007   normal taper--normal duplex  . DOPPLER ECHOCARDIOGRAPHY  10/17/2007   EF=>55%; -LA -moderated diltaed; RA-moderately; borderline mitral valve prolapse of AMVL- moderate mitral regurg.; moderatetricuspid regurg.--rgt ventricular systolic pressure elevated 30-40 mmHg  . NM MYOVIEW LTD  06/21/2011   EF 78%  LV systolic function normal - NORMAL STUDY  . TRANSESOPHAGEAL ECHOCARDIOGRAM  12/10/2007   normal left ventricle ; normal aortic valve there was trivial  valvular regurg; left atium markedly dilated no evidence for atrial thrombus,; mild to moderate tricuspid valvular regurg. , right atrium markedly dilated     OB History   None      Home Medications    Prior to Admission medications   Medication Sig Start Date End Date Taking? Authorizing Provider  acetaminophen (TYLENOL) 325 MG tablet Take 650 mg by mouth daily as needed for mild pain.    [provider]  aspirin 81 MG tablet Take 1 tablet (81 mg total) by mouth daily. 07/05/15   Croitoru, Mihai, MD  calcium-vitamin D (OSCAL WITH D) 500-200 MG-UNIT per tablet Take 1 tablet by mouth daily.    [provider]  carvedilol (COREG) 12.5 MG tablet Take 12.5 mg by mouth 2 (two) times daily with a meal.    [provider]  Cholecalciferol (VITAMIN D PO) Take 1 Units by mouth daily.  [provider]  diazepam (VALIUM) 5 MG tablet Take 2.5 mg by mouth 2 (two) times daily. Take 0.5 tablet (2.5 mg total) by mouth every morning and take 1 tablet (5 mg total) by mouth every evening. 11/19/14   [provider]  meclizine (ANTIVERT) 25 MG tablet Take 25 mg by mouth 3 (three) times daily as needed for dizziness.    [provider]  ondansetron (ZOFRAN-ODT) 4 MG disintegrating tablet Take 1 tablet by mouth every 8 (eight) hours as needed.    [provider]  Polyvinyl Alcohol-Povidone  (REFRESH OP) Place 1 drop into both eyes 2 (two) times daily as needed (DRY EYES).    [provider]  pravastatin (PRAVACHOL) 40 MG tablet Take 40 mg by mouth daily.    [provider]  senna-docusate (COLACE 2-IN-1) 8.6-50 MG tablet Take 1 tablet by mouth at bedtime.    [provider]  traMADol (ULTRAM) 50 MG tablet Take 1 tablet (50 mg total) by mouth every 6 (six) hours as needed. 06/22/17   Vanetta Mulders, MD    Family History Family History  Problem Relation Age of Onset  . Heart disease Mother   . Heart attack Father   . Diabetes Sister   . Cancer Brother     Social History Social History   Tobacco Use  . Smoking status: Never Smoker  . Smokeless tobacco: Never Used  Substance Use Topics  . Alcohol use: No  . Drug use: No     Allergies   Codeine; Metoprolol; Morphine and related; and Pantoprazole   Review of Systems Review of Systems  Constitutional: Negative for fever.  HENT: Negative for sore throat.   Eyes: Negative for visual disturbance.  Respiratory: Negative for shortness of breath.   Cardiovascular: Negative for chest pain.  Gastrointestinal: Negative for abdominal pain.  Genitourinary: Negative for dysuria.  Musculoskeletal: Negative for back pain.  Skin: Negative for rash and wound.  Neurological: Negative for numbness.     Physical Exam Updated Vital Signs BP (!) 184/92   Pulse 96   Temp 98.3 F (36.8 C) (Oral)   Resp 16   SpO2 99%   Physical Exam  Constitutional: She appears well-developed and well-nourished. No distress.  HENT:  Head: Normocephalic and atraumatic.  Eyes: Conjunctivae are normal.  Neck: Neck supple.  Cardiovascular: Normal rate and intact distal pulses. An irregularly irregular rhythm present.  No murmur heard. Pulmonary/Chest: Effort normal and breath sounds normal. No respiratory distress.  Abdominal: Soft. There is no tenderness.  Musculoskeletal: She exhibits edema (Bilateral lower  extremity edema right greater than left.) and tenderness (Right calf although no cords noted.). She exhibits no deformity.  Neurological: She is alert. She has normal strength. No sensory deficit. GCS eye subscore is 4. GCS verbal subscore is 5. GCS motor subscore is 6.  Skin: Skin is warm and dry. Capillary refill takes less than 2 seconds.  Psychiatric: She has a normal mood and affect.  Nursing note and vitals reviewed.    ED Treatments / Results  Labs (all labs ordered are listed, but only abnormal results are displayed) Labs Reviewed  CBC - Abnormal; Notable for the following components:      Result Value   Hemoglobin 11.7 (*)    All other components within normal limits  BASIC METABOLIC PANEL - Abnormal; Notable for the following components:   Glucose, Bld 104 (*)    BUN 22 (*)    Creatinine, Ser 1.07 (*)  GFR calc non Af Amer 46 (*)    GFR calc Af Amer 53 (*)    All other components within normal limits    EKG EKG Interpretation  Date/Time:  Monday November 19 2017 20:47:55 EDT Ventricular Rate:  92 PR Interval:    QRS Duration: 74 QT Interval:  374 QTC Calculation: 462 R Axis:   145 Text Interpretation:  Atrial fibrillation Right axis deviation Anteroseptal infarct , age undetermined Abnormal ECG since last tracing no significant change Confirmed by Rolan Bucco [phone]) on 11/20/2017 9:17:11 AM   Radiology No results found.  Procedures Procedures (including critical care time)  Medications Ordered in ED Medications - No data to display   Initial Impression / Assessment and Plan / ED Course  I have reviewed the triage vital signs and the nursing notes.  Pertinent labs & imaging results that were available during my care of the patient were reviewed by me and considered in my medical decision making (see chart for details).  Clinical Course as of Nov 22 1526  Tue Nov 20, 2017  0757 Patient here with increased peripheral edema bilaterally right greater than  left and right calf pain.  Reportedly sent here because of abnormal blood work and concern for DVT.  I tried to review any outside labs but did not see anything recent but I suppose she had a positive d-dimer.  We have ordered a duplex for evaluation.   [MB]  [id number] She had a vascular ultrasound that was negative for DVT.  Will review with patient and likely discharge home to follow-up with her PCP.   [MB]    Clinical Course User Index [MB] Terrilee Files, MD    Final Clinical Impressions(s) / ED Diagnoses   Final diagnoses:  Leg swelling  Right leg pain    ED Discharge Orders    None       Terrilee Files, MD 11/21/17 1529

## 2017-11-20 NOTE — ED Notes (Signed)
pA

## 2017-11-20 NOTE — Discharge Instructions (Addendum)
Your evaluated in the emergency department for right leg pain and swelling that your primary care doctor was concerned may be a blood clot.  You had an ultrasound that showed no evidence of blood clot.  You should elevate the area and use ice or heat to the area to help.  please follow-up with your doctor and return if any problems.

## 2017-11-20 NOTE — Progress Notes (Signed)
*  Preliminary Results* Right lower extremity venous duplex completed. Right lower extremity is negative for deep vein thrombosis. There is no evidence of right Baker's cyst.  11/20/2017 8:33 AM  Gertie FeyMichelle Khloie Hamada, BS, RVT, RDCS, RDMS

## 2017-11-28 DIAGNOSIS — R600 Localized edema: Secondary | ICD-10-CM | POA: Diagnosis not present

## 2018-01-08 ENCOUNTER — Other Ambulatory Visit: Payer: Self-pay | Admitting: Orthopedic Surgery

## 2018-01-08 ENCOUNTER — Ambulatory Visit: Payer: Self-pay | Admitting: Orthopedic Surgery

## 2018-01-08 ENCOUNTER — Ambulatory Visit
Admission: RE | Admit: 2018-01-08 | Discharge: 2018-01-08 | Disposition: A | Discharge status: Home / Self Care | Payer: Medicare Other | Source: Ambulatory Visit | Attending: Orthopedic Surgery | Admitting: Orthopedic Surgery

## 2018-01-08 DIAGNOSIS — R52 Pain, unspecified: Secondary | ICD-10-CM

## 2018-01-08 DIAGNOSIS — S7011XA Contusion of right thigh, initial encounter: Secondary | ICD-10-CM | POA: Diagnosis not present

## 2018-01-08 DIAGNOSIS — M25572 Pain in left ankle and joints of left foot: Secondary | ICD-10-CM | POA: Diagnosis not present

## 2018-01-08 DIAGNOSIS — M1711 Unilateral primary osteoarthritis, right knee: Secondary | ICD-10-CM | POA: Diagnosis not present

## 2018-01-08 DIAGNOSIS — M25561 Pain in right knee: Secondary | ICD-10-CM | POA: Diagnosis not present

## 2018-01-22 DIAGNOSIS — M25562 Pain in left knee: Secondary | ICD-10-CM | POA: Diagnosis not present

## 2018-01-22 DIAGNOSIS — M25572 Pain in left ankle and joints of left foot: Secondary | ICD-10-CM | POA: Diagnosis not present

## 2018-01-22 DIAGNOSIS — M25561 Pain in right knee: Secondary | ICD-10-CM | POA: Diagnosis not present

## 2018-01-31 DIAGNOSIS — Z0000 Encounter for general adult medical examination without abnormal findings: Secondary | ICD-10-CM | POA: Diagnosis not present

## 2018-01-31 DIAGNOSIS — N183 Chronic kidney disease, stage 3 (moderate): Secondary | ICD-10-CM | POA: Diagnosis not present

## 2018-01-31 DIAGNOSIS — I10 Essential (primary) hypertension: Secondary | ICD-10-CM | POA: Diagnosis not present

## 2018-01-31 DIAGNOSIS — E7800 Pure hypercholesterolemia, unspecified: Secondary | ICD-10-CM | POA: Diagnosis not present

## 2018-03-22 DIAGNOSIS — R269 Unspecified abnormalities of gait and mobility: Secondary | ICD-10-CM | POA: Diagnosis not present

## 2018-03-22 DIAGNOSIS — I10 Essential (primary) hypertension: Secondary | ICD-10-CM | POA: Diagnosis not present

## 2018-03-22 DIAGNOSIS — I4891 Unspecified atrial fibrillation: Secondary | ICD-10-CM | POA: Diagnosis not present

## 2018-03-25 DIAGNOSIS — E2839 Other primary ovarian failure: Secondary | ICD-10-CM | POA: Diagnosis not present

## 2018-03-25 DIAGNOSIS — M8588 Other specified disorders of bone density and structure, other site: Secondary | ICD-10-CM | POA: Diagnosis not present

## 2018-03-25 DIAGNOSIS — M810 Age-related osteoporosis without current pathological fracture: Secondary | ICD-10-CM | POA: Diagnosis not present

## 2018-04-05 DIAGNOSIS — Z23 Encounter for immunization: Secondary | ICD-10-CM | POA: Diagnosis not present

## 2018-07-12 DIAGNOSIS — H40013 Open angle with borderline findings, low risk, bilateral: Secondary | ICD-10-CM | POA: Diagnosis not present

## 2018-07-12 DIAGNOSIS — E119 Type 2 diabetes mellitus without complications: Secondary | ICD-10-CM | POA: Diagnosis not present

## 2018-07-12 DIAGNOSIS — H353131 Nonexudative age-related macular degeneration, bilateral, early dry stage: Secondary | ICD-10-CM | POA: Diagnosis not present

## 2018-07-12 DIAGNOSIS — Z961 Presence of intraocular lens: Secondary | ICD-10-CM | POA: Diagnosis not present

## 2018-07-23 DIAGNOSIS — I4891 Unspecified atrial fibrillation: Secondary | ICD-10-CM | POA: Diagnosis not present

## 2018-07-23 DIAGNOSIS — R238 Other skin changes: Secondary | ICD-10-CM | POA: Diagnosis not present

## 2018-07-23 DIAGNOSIS — I10 Essential (primary) hypertension: Secondary | ICD-10-CM | POA: Diagnosis not present

## 2018-07-23 DIAGNOSIS — E7800 Pure hypercholesterolemia, unspecified: Secondary | ICD-10-CM | POA: Diagnosis not present

## 2018-08-16 ENCOUNTER — Encounter: Payer: Self-pay | Admitting: Cardiovascular Disease

## 2018-08-16 ENCOUNTER — Ambulatory Visit: Payer: Medicare Other | Admitting: Cardiovascular Disease

## 2018-08-16 VITALS — BP 160/90 | HR 59 | Ht 66.0 in | Wt 129.2 lb

## 2018-08-16 DIAGNOSIS — E7800 Pure hypercholesterolemia, unspecified: Secondary | ICD-10-CM

## 2018-08-16 DIAGNOSIS — I4821 Permanent atrial fibrillation: Secondary | ICD-10-CM

## 2018-08-16 DIAGNOSIS — I10 Essential (primary) hypertension: Secondary | ICD-10-CM | POA: Diagnosis not present

## 2018-08-16 DIAGNOSIS — M8000XS Age-related osteoporosis with current pathological fracture, unspecified site, sequela: Secondary | ICD-10-CM

## 2018-08-16 NOTE — Progress Notes (Signed)
Patient ID: Rebecca Bradshaw, female   DOB: 27-Sep-1930, 85 y.o.   MRN: [id number]    Cardiology Office Note    Date:  08/17/2018   ID:  Rebecca Bradshaw, DOB [birth year], MRN [id number]  PCP:  Johny Blamer, MD  Cardiologist:   Thurmon Fair, MD   Chief complaint: back pain   History of Present Illness:  Rebecca Bradshaw is a 85 y.o. female with permanent atrial fibrillation and HTN. Although she is still living independently, she remains quite unsteady and prone to falls. For this reason, she is not on anticoagulation any longer.   She has not had any recent falls and has not had any serious injuries since the burst vertebral body fracture about a year ago.  She continues have some problems with back pain, but this has improved.  She is very slender with a BMI that is now under 21, but has recently managed to get some weight back.  The patient specifically denies any chest pain at rest exertion, dyspnea at rest or with exertion, orthopnea, paroxysmal nocturnal dyspnea, syncope, palpitations, focal neurological deficits, intermittent claudication, lower extremity edema, unexplained weight gain, cough, hemoptysis or wheezing.  She has not had any complaints with unilateral weakness, changes in balance, or amaurosis fugax, dysarthria/aphasia, facial asymmetry or other signs of focal neurological event.  By previous echocardiography she has borderline mitral valve prolapse with moderate mitral regurgitation and a moderate biatrial dilatation. She has normal left ventricular systolic function and had a normal myocardial perfusion study several years ago.  Past Medical History:  Diagnosis Date  . A-fib (HCC)   . Arrhythmia 2014   chronic/permanent atrial fibrillation -rate control with carvedilol and anticoag wth warfarin  . Chronic diarrhea   . Essential hypertension 06/30/2013  . History of diverticulitis   . Hyperlipidemia    on statin therapy    Past Surgical History:  Procedure  Laterality Date  . abdominal  aorta doppler  10/17/2007   normal taper--normal duplex  . DOPPLER ECHOCARDIOGRAPHY  10/17/2007   EF=>55%; -LA -moderated diltaed; RA-moderately; borderline mitral valve prolapse of AMVL- moderate mitral regurg.; moderatetricuspid regurg.--rgt ventricular systolic pressure elevated 30-40 mmHg  . NM MYOVIEW LTD  06/21/2011   EF 78%  LV systolic function normal - NORMAL STUDY  . TRANSESOPHAGEAL ECHOCARDIOGRAM  12/10/2007   normal left ventricle ; normal aortic valve there was trivial  valvular regurg; left atium markedly dilated no evidence for atrial thrombus,; mild to moderate tricuspid valvular regurg. , right atrium markedly dilated    Outpatient Medications Prior to Visit  Medication Sig Dispense Refill  . acetaminophen (TYLENOL) 325 MG tablet Take 650 mg by mouth daily as needed for mild pain.    Marland Kitchen aspirin 81 MG tablet Take 1 tablet (81 mg total) by mouth daily. 30 tablet 6  . calcium-vitamin D (OSCAL WITH D) 500-200 MG-UNIT per tablet Take 1 tablet by mouth daily.    . carvedilol (COREG) 12.5 MG tablet Take 12.5 mg by mouth 2 (two) times daily with a meal.    . Cholecalciferol (VITAMIN D PO) Take 1 Units by mouth daily.     . diazepam (VALIUM) 5 MG tablet Take 2.5 mg by mouth 2 (two) times daily. Take 0.5 tablet (2.5 mg total) by mouth every morning and take 1 tablet (5 mg total) by mouth every evening.  0  . meclizine (ANTIVERT) 25 MG tablet Take 25 mg by mouth 3 (three) times daily as needed for dizziness.    Marland Kitchen  ondansetron (ZOFRAN-ODT) 4 MG disintegrating tablet Take 1 tablet by mouth every 8 (eight) hours as needed.    . Polyvinyl Alcohol-Povidone (REFRESH OP) Place 2 drops into both eyes 2 (two) times daily as needed (DRY EYES).     . pravastatin (PRAVACHOL) 40 MG tablet Take 40 mg by mouth daily.    Marland Kitchen senna-docusate (COLACE 2-IN-1) 8.6-50 MG tablet Take 1 tablet by mouth at bedtime.    . traMADol (ULTRAM) 50 MG tablet Take 1 tablet (50 mg total) by mouth  every 6 (six) hours as needed. 15 tablet 0   No facility-administered medications prior to visit.      Allergies:   Codeine; Metoprolol; Morphine and related; and Pantoprazole   Social History   Socioeconomic History  . Marital status: Widowed    Spouse name: Not on file  . Number of children: Not on file  . Years of education: Not on file  . Highest education level: Not on file  Occupational History  . Not on file  Social Needs  . Financial resource strain: Not on file  . Food insecurity:    Worry: Not on file    Inability: Not on file  . Transportation needs:    Medical: Not on file    Non-medical: Not on file  Tobacco Use  . Smoking status: Never Smoker  . Smokeless tobacco: Never Used  Substance and Sexual Activity  . Alcohol use: No  . Drug use: No  . Sexual activity: Not on file  Lifestyle  . Physical activity:    Days per week: Not on file    Minutes per session: Not on file  . Stress: Not on file  Relationships  . Social connections:    Talks on phone: Not on file    Gets together: Not on file    Attends religious service: Not on file    Active member of club or organization: Not on file    Attends meetings of clubs or organizations: Not on file    Relationship status: Not on file  Other Topics Concern  . Not on file  Social History Narrative  . Not on file     Family History:  The patient's family history includes Cancer in her brother; Diabetes in her sister; Heart attack in her father; Heart disease in her mother.   ROS:   Please see the history of present illness.    ROS All other systems are reviewed and are negative   PHYSICAL EXAM:   VS:  BP (!) 160/90   Pulse (!) 59   Ht 5\' 6"  (1.676 m)   Wt 129 lb 3.2 oz (58.6 kg)   SpO2 99%   BMI 20.85 kg/m      General: Alert, oriented x3, no distress, she appears very thin and frail Head: no evidence of trauma, PERRL, EOMI, no exophtalmos or lid lag, no myxedema, no xanthelasma; normal ears,  nose and oropharynx Neck: normal jugular venous pulsations and no hepatojugular reflux; brisk carotid pulses without delay and no carotid bruits Chest: clear to auscultation, no signs of consolidation by percussion or palpation, normal fremitus, symmetrical and full respiratory excursions Cardiovascular: normal position and quality of the apical impulse, irregular rhythm, normal first and second heart sounds, 2/6 apical holosystolic murmur, no diastolic murmurs, rubs or gallops Abdomen: no tenderness or distention, no masses by palpation, no abnormal pulsatility or arterial bruits, normal bowel sounds, no hepatosplenomegaly Extremities: no clubbing, cyanosis or edema; 2+ radial, ulnar and brachial  pulses bilaterally; 2+ right femoral, posterior tibial and dorsalis pedis pulses; 2+ left femoral, posterior tibial and dorsalis pedis pulses; no subclavian or femoral bruits Neurological: grossly nonfocal except for mild resting tremor Psych: Normal mood and affect    Wt Readings from Last 3 Encounters:  08/16/18 129 lb 3.2 oz (58.6 kg)  08/10/17 134 lb (60.8 kg)  07/21/16 136 lb 6.4 oz (61.9 kg)      Studies/Labs Reviewed:   EKG:  EKG is not ordered today.   BMET    Component Value Date/Time   NA 138 11/19/2017 2047   K 3.8 11/19/2017 2047   CL 104 11/19/2017 2047   CO2 27 11/19/2017 2047   GLUCOSE 104 (H) 11/19/2017 2047   BUN 22 (H) 11/19/2017 2047   CREATININE 1.07 (H) 11/19/2017 2047   CREATININE 0.87 06/26/2013 1635   CALCIUM 8.9 11/19/2017 2047   GFRNONAA 46 (L) 11/19/2017 2047   GFRAA 53 (L) 11/19/2017 2047   Lipid Panel  No results found for: CHOL, TRIG, HDL, CHOLHDL, VLDL, LDLCALC, LDLDIRECT  Labs from 10/21/2018 Total cholesterol 131, HDL 60, LDL 61, triglycerides 53 Hemoglobin 12.7, creatinine 0.8, potassium 4.2, TSH 5.2 ASSESSMENT:    1. Permanent atrial fibrillation   2. Essential hypertension   3. Hypercholesterolemia   4. Age-related osteoporosis with current  pathological fracture, sequela      PLAN:  In order of problems listed above:  1. Permanent atrial fibrillation: Has good rate control.  Continue aspirin for stroke prevention. Intermediate embolic risk CHADSVasc 4(age 80, gender, hypertension), but felt to be high risk for injury and bleeding.  Although she has not had any recent falls she continues to look very frail and unsteady. 2. HTN: Blood pressure has been consistently elevated at her office visits, including today.  I see a report of blood pressure 170/86 on February 11.  She does have a history of orthostatic hypotension.  Asked to record her blood pressure at home sitting and standing and send me a record after about a week's time.  No changes are made to the medications today. 3. HLP: Excellent recent lipid profile 4. Osteoporosis/vertebral compression fracture   Medication Adjustments/Labs and Tests Ordered: Current medicines are reviewed at length with the patient today.  Concerns regarding medicines are outlined above.  Medication changes, Labs and Tests ordered today are listed in the Patient Instructions below. Patient Instructions  Medication Instructions:  Continue same medications If you need a refill on your cardiac medications before your next appointment, please call your pharmacy.   Lab work: None ordered   Testing/Procedures: None ordered  Follow-Up: At [facility], you and your health needs are our priority.  As part of our continuing mission to provide you with exceptional heart care, we have created designated Provider Care Teams.  These Care Teams include your primary Cardiologist (physician) and Advanced Practice Providers (APPs -  Physician Assistants and Nurse Practitioners) who all work together to provide you with the care you need, when you need it. . Follow up appointment wit Dr.Carl Butner in 12 months  Check Blood Pressure sitting and standing every day for 1 week.Send Dr.Krystyne Tewksbury  readings   Signed, Thurmon FairMihai Aayana Reinertsen, MD  08/17/2018 9:18 AM    [facility] [address], [place], [place]  [address] Phone: [phone]; Fax: [phone]

## 2018-08-16 NOTE — Patient Instructions (Signed)
Medication Instructions:  Continue same medications If you need a refill on your cardiac medications before your next appointment, please call your pharmacy.   Lab work: None ordered   Testing/Procedures: None ordered  Follow-Up: At [facility], you and your health needs are our priority.  As part of our continuing mission to provide you with exceptional heart care, we have created designated Provider Care Teams.  These Care Teams include your primary Cardiologist (physician) and Advanced Practice Providers (APPs -  Physician Assistants and Nurse Practitioners) who all work together to provide you with the care you need, when you need it. . Follow up appointment wit Dr.Croitoru in 12 months  Check Blood Pressure sitting and standing every day for 1 week.Send Dr.Croitoru readings

## 2018-09-10 ENCOUNTER — Telehealth: Payer: Self-pay | Admitting: Cardiovascular Disease

## 2018-09-10 MED ORDER — CARVEDILOL 25 MG PO TABS: 25.0000 mg | ORAL_TABLET | Freq: Two times a day (BID) | ORAL | 1 refills | Status: DC

## 2018-09-10 NOTE — Telephone Encounter (Signed)
BP  Is consistently high. (I believe the sitting/standing are inverted) Please increase the carvedilol to 25 mg twice daily.

## 2018-09-10 NOTE — Telephone Encounter (Signed)
New message:   Patient calling to report BP,but states she shakes so bad she do not understand what she put down. patient states she is ok, but her hurts sometime please call patient.

## 2018-09-10 NOTE — Telephone Encounter (Signed)
I called patient she is aware of change in medication and that it was sent to pharmacy.   Patient advised to call back if any other questions or concerns.

## 2018-09-10 NOTE — Telephone Encounter (Signed)
I called patient, she states that at her last OV she was advised to call with her blood pressures for a week standing and sitting.   Patient states her hand shakes and she did not think Dr.C could read them, so she read them off to me.   March 9th 156/86 sitting 188/110 standing  March 10th 144/93 sitting 148/94 standing  March 11th 145/96 sitting 194/119 standing  March 12th 163/99 sitting 137/110 standing  March 13th  146/85 sitting 143/113 standing  Please advise if any changes should be made to medication list.  Thank you. Patient denies any symptoms at this time, does mention a headache.

## 2018-12-05 ENCOUNTER — Other Ambulatory Visit: Payer: Self-pay | Admitting: Cardiovascular Disease

## 2018-12-05 NOTE — Telephone Encounter (Signed)
New Message     *STAT* If patient is at the pharmacy, call can be transferred to refill team.   1. Which medications need to be refilled? (please list name of each medication and dose if known) Carvedilol 25mg  1 tablet by mouth twice a day   2. Which pharmacy/location (including street and city if local pharmacy) is medication to be sent to? [facility] [address]  3. Do they need a 30 day or 90 day supply? 90 day supply

## 2019-01-22 DIAGNOSIS — I4891 Unspecified atrial fibrillation: Secondary | ICD-10-CM | POA: Diagnosis not present

## 2019-01-22 DIAGNOSIS — F411 Generalized anxiety disorder: Secondary | ICD-10-CM | POA: Diagnosis not present

## 2019-01-22 DIAGNOSIS — R251 Tremor, unspecified: Secondary | ICD-10-CM | POA: Diagnosis not present

## 2019-01-22 DIAGNOSIS — I10 Essential (primary) hypertension: Secondary | ICD-10-CM | POA: Diagnosis not present

## 2019-03-12 DIAGNOSIS — Z23 Encounter for immunization: Secondary | ICD-10-CM | POA: Diagnosis not present

## 2019-04-23 ENCOUNTER — Other Ambulatory Visit: Payer: Self-pay | Admitting: Cardiovascular Disease

## 2019-04-23 MED ORDER — CARVEDILOL 25 MG PO TABS: ORAL_TABLET | ORAL | 3 refills | Status: DC

## 2019-04-23 NOTE — Telephone Encounter (Signed)
This is Dr. Croitoru's pt 

## 2019-04-24 MED ORDER — PRAVASTATIN SODIUM 40 MG PO TABS: 40.0000 mg | ORAL_TABLET | Freq: Every day | ORAL | 3 refills | Status: AC

## 2019-04-24 NOTE — Telephone Encounter (Signed)
Rx request sent to pharmacy.  

## 2019-07-30 DIAGNOSIS — H6123 Impacted cerumen, bilateral: Secondary | ICD-10-CM | POA: Diagnosis not present

## 2019-07-30 DIAGNOSIS — I10 Essential (primary) hypertension: Secondary | ICD-10-CM | POA: Diagnosis not present

## 2019-07-30 DIAGNOSIS — Z0000 Encounter for general adult medical examination without abnormal findings: Secondary | ICD-10-CM | POA: Diagnosis not present

## 2019-07-30 DIAGNOSIS — E7800 Pure hypercholesterolemia, unspecified: Secondary | ICD-10-CM | POA: Diagnosis not present

## 2019-09-03 ENCOUNTER — Ambulatory Visit: Payer: Medicare Other | Admitting: Cardiovascular Disease

## 2019-09-09 ENCOUNTER — Encounter: Payer: Self-pay | Admitting: Cardiovascular Disease

## 2019-09-09 ENCOUNTER — Ambulatory Visit: Payer: Medicare Other | Admitting: Cardiovascular Disease

## 2019-09-09 ENCOUNTER — Other Ambulatory Visit: Payer: Self-pay

## 2019-09-09 VITALS — BP 166/90 | HR 64 | Ht 66.0 in | Wt 136.6 lb

## 2019-09-09 DIAGNOSIS — I4821 Permanent atrial fibrillation: Secondary | ICD-10-CM | POA: Diagnosis not present

## 2019-09-09 DIAGNOSIS — I10 Essential (primary) hypertension: Secondary | ICD-10-CM

## 2019-09-09 DIAGNOSIS — E7800 Pure hypercholesterolemia, unspecified: Secondary | ICD-10-CM

## 2019-09-09 DIAGNOSIS — I951 Orthostatic hypotension: Secondary | ICD-10-CM

## 2019-09-09 NOTE — Progress Notes (Signed)
Cardiology Office Note:    Date:  09/10/2019   ID:  Rebecca Bradshaw, DOB April 03, 1931, MRN [id number]  PCP:  Johny Blamer, MD  Cardiologist:  Thurmon Fair, MD  Electrophysiologist:  None   Referring MD: Johny Blamer, MD   Chief complaint: Hypertension follow-up  History of Present Illness:    Rebecca Bradshaw is a 86 y.o. female with a past medical side-sitting 6 she has also hypertension hyperlipidemia and history of vertebral body fracture also borderline MVP and moderate MR hx of permanent A. fib, HTN, H LD, who presented today for follow-up.  She does not report any new complaints.  No chest pain, no shortness of breath, no worsening of lower extremity edema.  She complains of back pain that has been chronic.  She was seen in cardiology clinic 1 year ago and meanwhile her Coreg was increased to 25 mg twice daily due to consistently elevated blood pressure at home.  She mentions that she is doing well with increased dose however her blood pressure has been sometimes elevated at home (she does not know the numbers).  She has palpitation once in a while but endorses that it does not bother her often. Also reports occasional lightheadedness and dizziness when she gets up. She mentions that it has been the same since last visit and does not report any changes. She lives alone. No recent fall.  Past Medical History:  Diagnosis Date  . A-fib (HCC)   . Arrhythmia 2014   chronic/permanent atrial fibrillation -rate control with carvedilol and anticoag wth warfarin  . Chronic diarrhea   . Essential hypertension 06/30/2013  . History of diverticulitis   . Hyperlipidemia    on statin therapy    Past Surgical History:  Procedure Laterality Date  . abdominal  aorta doppler  10/17/2007   normal taper--normal duplex  . DOPPLER ECHOCARDIOGRAPHY  10/17/2007   EF=>55%; -LA -moderated diltaed; RA-moderately; borderline mitral valve prolapse of AMVL- moderate mitral regurg.; moderatetricuspid  regurg.--rgt ventricular systolic pressure elevated 30-40 mmHg  . NM MYOVIEW LTD  06/21/2011   EF 78%  LV systolic function normal - NORMAL STUDY  . TRANSESOPHAGEAL ECHOCARDIOGRAM  12/10/2007   normal left ventricle ; normal aortic valve there was trivial  valvular regurg; left atium markedly dilated no evidence for atrial thrombus,; mild to moderate tricuspid valvular regurg. , right atrium markedly dilated    Current Medications: Current Meds  Medication Sig  . acetaminophen (TYLENOL) 325 MG tablet Take 650 mg by mouth daily as needed for mild pain.  Marland Kitchen aspirin 81 MG tablet Take 1 tablet (81 mg total) by mouth daily.  . calcium-vitamin D (OSCAL WITH D) 500-200 MG-UNIT per tablet Take 1 tablet by mouth daily.  . carvedilol (COREG) 25 MG tablet TAKE 1 TABLET BY MOUTH TWICE DAILY WITH A MEAL  . Cholecalciferol (VITAMIN D PO) Take 1 Units by mouth daily.   . diazepam (VALIUM) 5 MG tablet Take 2.5 mg by mouth 2 (two) times daily. Take 0.5 tablet (2.5 mg total) by mouth every morning and take 1 tablet (5 mg total) by mouth every evening.  . meclizine (ANTIVERT) 25 MG tablet Take 25 mg by mouth 3 (three) times daily as needed for dizziness.  . ondansetron (ZOFRAN-ODT) 4 MG disintegrating tablet Take 1 tablet by mouth every 8 (eight) hours as needed.  . Polyvinyl Alcohol-Povidone (REFRESH OP) Place 2 drops into both eyes 2 (two) times daily as needed (DRY EYES).   . pravastatin (PRAVACHOL) 40 MG  tablet Take 1 tablet (40 mg total) by mouth daily.  Marland Kitchen senna-docusate (COLACE 2-IN-1) 8.6-50 MG tablet Take 1 tablet by mouth at bedtime.  . traMADol (ULTRAM) 50 MG tablet Take 1 tablet (50 mg total) by mouth every 6 (six) hours as needed.     Allergies:   Codeine, Metoprolol, Morphine and related, and Pantoprazole   Social History   Socioeconomic History  . Marital status: Widowed    Spouse name: Not on file  . Number of children: Not on file  . Years of education: Not on file  . Highest education  level: Not on file  Occupational History  . Not on file  Tobacco Use  . Smoking status: Never Smoker  . Smokeless tobacco: Never Used  Substance and Sexual Activity  . Alcohol use: No  . Drug use: No  . Sexual activity: Not on file  Other Topics Concern  . Not on file  Social History Narrative  . Not on file   Social Determinants of Health   Financial Resource Strain:   . Difficulty of Paying Living Expenses:   Food Insecurity:   . Worried About Charity fundraiser in the Last Year:   . Arboriculturist in the Last Year:   Transportation Needs:   . Film/video editor (Medical):   Marland Kitchen Lack of Transportation (Non-Medical):   Physical Activity:   . Days of Exercise per Week:   . Minutes of Exercise per Session:   Stress:   . Feeling of Stress :   Social Connections:   . Frequency of Communication with Friends and Family:   . Frequency of Social Gatherings with Friends and Family:   . Attends Religious Services:   . Active Member of Clubs or Organizations:   . Attends Archivist Meetings:   Marland Kitchen Marital Status:     Family History: The patient's family history includes Cancer in her brother; Diabetes in her sister; Heart attack in her father; Heart disease in her mother.  ROS:   Please see the history of present illness.    Constitutional: Negative for chills, had fever after receiving COVID vaccine.  Respiratory: Negative for shortness of breath.   Cardiovascular: Negative for chest pain and leg swelling.  Gastrointestinal: Negative for abdominal pain, nausea and vomiting.  Neurological: positive for dizziness and headaches.    EKGs/Labs/Other Studies Reviewed:    The following studies were reviewed today:   EKG:  EKG is  ordered today.  The ekg ordered today demonstrates A. fib with heart rate of 64.  Recent Labs: No results found for requested labs within last 8760 hours.  Recent Lipid Panel No results found for: CHOL, TRIG, HDL, CHOLHDL, VLDL,  LDLCALC, LDLDIRECT  Physical Exam:    VS:  BP (!) 166/90   Pulse 64   Ht 5\' 6"  (1.676 m)   Wt 136 lb 9.6 oz (62 kg)   SpO2 97%   BMI 22.05 kg/m     Wt Readings from Last 3 Encounters:  09/09/19 136 lb 9.6 oz (62 kg)  08/16/18 129 lb 3.2 oz (58.6 kg)  08/10/17 134 lb (60.8 kg)     GEN: Pleasant elderly female, in no acute distress HEENT: Normal NECK: No JVD; No carotid bruits LYMPHATICS: No lymphadenopathy CARDIAC: Irregular rhythm, systolic murmur RESPIRATORY:  Clear to auscultation without rales, wheezing or rhonchi  ABDOMEN: Soft, non-tender, non-distended MUSCULOSKELETAL: 1+ bilateral lower extremity pitting edema; No deformity  SKIN: Warm and dry NEUROLOGIC:  Alert and oriented at baseline PSYCHIATRIC:  Normal affect   ASSESSMENT:    1. Permanent atrial fibrillation 2. Essential hypertension 3. Hypercholesterolemia 4. Age-related osteoporosis with current pathological fracture.  PLAN:    In order of problems listed above:  1.  Permanent A. Fib: No symptoms Irregular rhythm today and Afib on EKG. Heart rate is controlled on beta-blocker. -She has not been on anticoagulation due to high risk for fall. -Continue Coreg  2.  HTN: Coreg dose increased to 25 mg twice daily about a year ago (09/10/2018) given consistently elevated blood pressure.  She tolerated that well, heart rate is 64.  occasional chronic dizziness. Her BP today was 166/90>repeated measure: 152/89. Her BP is acceptable and we do not increase her antihypertensive meds given Hx od orthostatic hypotension and falls.  -Continue Coreg 25 mg twice daily  3. HLP: on pravastatin 40 mg daily.  Last lipid profile 07/30/2019 showed great numbers with HDL of 58, LDL of 61, TG 56 -Continue statin  Medication Adjustments/Labs and Tests Ordered: Current medicines are reviewed at length with the patient today.  Concerns regarding medicines are outlined above.  Orders Placed This Encounter  Procedures  . EKG  12-Lead   No orders of the defined types were placed in this encounter.   Patient Instructions  Medication Instructions:  No changes *If you need a refill on your cardiac medications before your next appointment, please call your pharmacy*   Lab Work: None ordered If you have labs (blood work) drawn today and your tests are completely normal, you will receive your results only by: Marland Kitchen MyChart Message (if you have MyChart) OR . A paper copy in the mail If you have any lab test that is abnormal or we need to change your treatment, we will call you to review the results.   Testing/Procedures: None ordered   Follow-Up: At [facility], you and your health needs are our priority.  As part of our continuing mission to provide you with exceptional heart care, we have created designated Provider Care Teams.  These Care Teams include your primary Cardiologist (physician) and Advanced Practice Providers (APPs -  Physician Assistants and Nurse Practitioners) who all work together to provide you with the care you need, when you need it.  We recommend signing up for the patient portal called "MyChart".  Sign up information is provided on this After Visit Summary.  MyChart is used to connect with patients for Virtual Visits (Telemedicine).  Patients are able to view lab/test results, encounter notes, upcoming appointments, etc.  Non-urgent messages can be sent to your provider as well.   To learn more about what you can do with MyChart, go to [email or web address].    Your next appointment:   6 month(s)  The format for your next appointment:   In Person  Provider:   You may see Thurmon Fair, MD or one of the following Advanced Practice Providers on your designated Care Team:    Azalee Course, PA-C  Micah Flesher, [place] or   Judy Pimple, PA-C        Signed, Thurmon Fair, MD  09/10/2019 11:43 AM    [facility]  I have seen and examined the patient along  with Chevis Pretty, MD .  I have reviewed the chart, notes and new data.  I agree with PA/NP's note.  Key new complaints: After we reduced her blood pressure medications, she has had a marked reduction in the frequency of her falls,  but she still is unsteady.  She fell off a bucket while she was trying to pull some weeds just last week.  Thankfully, she has not had any serious bleeding problems.  She denies palpitations, angina or dyspnea. Key examination changes: Appears elderly and frail, prominent resting tremor of both hands, irregular rhythm, 2/6 late systolic apical murmur, no diastolic murmurs Key new findings / data: ECG shows atrial fibrillation with well-controlled ventricular.  PLAN: Continue same medical regimen.  "Perfect" blood pressure control has been associated with symptomatic orthostatic hypotension and falls.  I do not think she is a good candidate for chronic anticoagulation.  She is struggling with the decision regarding living arrangements.  She has been living in the same home independently for 30 years and enjoys her privacy.  Her children have been encouraging her to consider alternate living arrangements, where she will have more support.  I encouraged her to keep an open mind to the suggestion since this will likely be necessary at some point.  She does have a life alert necklace.  The community around her has been changing and some of her close friends and neighbors have passed away or are no longer living there.  She has less support than in the past.  Thurmon Fair, MD, [facility] [phone] 09/10/2019, 11:43 AM

## 2019-09-09 NOTE — Patient Instructions (Signed)

## 2019-10-21 ENCOUNTER — Other Ambulatory Visit: Payer: Self-pay | Admitting: Cardiovascular Disease

## 2020-01-12 ENCOUNTER — Other Ambulatory Visit: Payer: Self-pay | Admitting: Cardiovascular Disease

## 2020-02-10 DIAGNOSIS — H40013 Open angle with borderline findings, low risk, bilateral: Secondary | ICD-10-CM | POA: Diagnosis not present

## 2020-02-10 DIAGNOSIS — H353131 Nonexudative age-related macular degeneration, bilateral, early dry stage: Secondary | ICD-10-CM | POA: Diagnosis not present

## 2020-02-10 DIAGNOSIS — H04123 Dry eye syndrome of bilateral lacrimal glands: Secondary | ICD-10-CM | POA: Diagnosis not present

## 2020-02-10 DIAGNOSIS — R7309 Other abnormal glucose: Secondary | ICD-10-CM | POA: Diagnosis not present

## 2020-03-02 ENCOUNTER — Other Ambulatory Visit: Payer: Self-pay | Admitting: Cardiovascular Disease

## 2020-03-09 ENCOUNTER — Other Ambulatory Visit: Payer: Self-pay

## 2020-03-09 ENCOUNTER — Ambulatory Visit (INDEPENDENT_AMBULATORY_CARE_PROVIDER_SITE_OTHER): Payer: Medicare Other | Admitting: Cardiovascular Disease

## 2020-03-09 ENCOUNTER — Encounter: Payer: Self-pay | Admitting: Cardiovascular Disease

## 2020-03-09 VITALS — BP 150/74 | HR 82 | Ht 66.0 in | Wt 131.0 lb

## 2020-03-09 DIAGNOSIS — I10 Essential (primary) hypertension: Secondary | ICD-10-CM

## 2020-03-09 DIAGNOSIS — M8000XS Age-related osteoporosis with current pathological fracture, unspecified site, sequela: Secondary | ICD-10-CM

## 2020-03-09 DIAGNOSIS — I4821 Permanent atrial fibrillation: Secondary | ICD-10-CM

## 2020-03-09 DIAGNOSIS — E7800 Pure hypercholesterolemia, unspecified: Secondary | ICD-10-CM | POA: Diagnosis not present

## 2020-03-09 NOTE — Progress Notes (Signed)
Patient ID: Rebecca Bradshaw, female   DOB: [birth year], 86 y.o.   MRN: [id number]    Cardiology Office Note    Date:  03/14/2020   ID:  RHONDALYN CLINGAN, DOB [birth year], MRN [id number]  PCP:  Johny Blamer, MD  Cardiologist:   Thurmon Fair, MD   Chief Complaint  Patient presents with  . Dizziness  . Atrial Fibrillation    History of Present Illness:  Rebecca Bradshaw is a 86 y.o. female with permanent atrial fibrillation and HTN. Although she is still living independently, she remains quite unsteady and prone to falls. For this reason, she is not on anticoagulation any longer.   She still has occasional dizziness, but she has not had any falls.  She has lots of bruising nevertheless.  She continues to have some problems with back pain since the birth the vertebral body fracture from a fall over a year ago.  Her weight loss has stabilized.  The patient specifically denies any chest pain at rest exertion, dyspnea at rest or with exertion, orthopnea, paroxysmal nocturnal dyspnea, syncope, palpitations, focal neurological deficits, intermittent claudication, lower extremity edema, unexplained weight gain, cough, hemoptysis or wheezing.  By previous echocardiography she has borderline mitral valve prolapse with moderate mitral regurgitation and a moderate biatrial dilatation. She has normal left ventricular systolic function and had a normal myocardial perfusion study several years ago.  Past Medical History:  Diagnosis Date  . A-fib (HCC)   . Arrhythmia 2014   chronic/permanent atrial fibrillation -rate control with carvedilol and anticoag wth warfarin  . Chronic diarrhea   . Essential hypertension 06/30/2013  . History of diverticulitis   . Hyperlipidemia    on statin therapy    Past Surgical History:  Procedure Laterality Date  . abdominal  aorta doppler  10/17/2007   normal taper--normal duplex  . DOPPLER ECHOCARDIOGRAPHY  10/17/2007   EF=>55%; -LA -moderated diltaed;  RA-moderately; borderline mitral valve prolapse of AMVL- moderate mitral regurg.; moderatetricuspid regurg.--rgt ventricular systolic pressure elevated 30-40 mmHg  . NM MYOVIEW LTD  06/21/2011   EF 78%  LV systolic function normal - NORMAL STUDY  . TRANSESOPHAGEAL ECHOCARDIOGRAM  12/10/2007   normal left ventricle ; normal aortic valve there was trivial  valvular regurg; left atium markedly dilated no evidence for atrial thrombus,; mild to moderate tricuspid valvular regurg. , right atrium markedly dilated    Outpatient Medications Prior to Visit  Medication Sig Dispense Refill  . acetaminophen (TYLENOL) 325 MG tablet Take 650 mg by mouth daily as needed for mild pain.    Marland Kitchen aspirin 81 MG tablet Take 1 tablet (81 mg total) by mouth daily. 30 tablet 6  . calcium-vitamin D (OSCAL WITH D) 500-200 MG-UNIT per tablet Take 1 tablet by mouth daily.    . carvedilol (COREG) 25 MG tablet TAKE 1 TABLET BY MOUTH TWICE DAILY WITH A MEAL 90 tablet 0  . Cholecalciferol (VITAMIN D PO) Take 1 Units by mouth daily.     . diazepam (VALIUM) 10 MG tablet Take 5 mg by mouth daily.    . diazepam (VALIUM) 5 MG tablet Take 2.5 mg by mouth 2 (two) times daily. Take 0.5 tablet (2.5 mg total) by mouth every morning and take 1 tablet (5 mg total) by mouth every evening.  0  . meclizine (ANTIVERT) 25 MG tablet Take 25 mg by mouth 3 (three) times daily as needed for dizziness.    . Multiple Vitamins-Minerals (PRESERVISION AREDS PO) Take by mouth.    Marland Kitchen  ondansetron (ZOFRAN-ODT) 4 MG disintegrating tablet Take 1 tablet by mouth every 8 (eight) hours as needed.    . Polyvinyl Alcohol-Povidone (REFRESH OP) Place 2 drops into both eyes 2 (two) times daily as needed (DRY EYES).     . pravastatin (PRAVACHOL) 40 MG tablet Take 1 tablet (40 mg total) by mouth daily. 30 tablet 3  . senna-docusate (COLACE 2-IN-1) 8.6-50 MG tablet Take 1 tablet by mouth at bedtime.    . traMADol (ULTRAM) 50 MG tablet Take 1 tablet (50 mg total) by mouth  every 6 (six) hours as needed. 15 tablet 0   No facility-administered medications prior to visit.     Allergies:   Codeine, Metoprolol, Morphine and related, and Pantoprazole   Social History   Socioeconomic History  . Marital status: Widowed    Spouse name: Not on file  . Number of children: Not on file  . Years of education: Not on file  . Highest education level: Not on file  Occupational History  . Not on file  Tobacco Use  . Smoking status: Never Smoker  . Smokeless tobacco: Never Used  Vaping Use  . Vaping Use: Never used  Substance and Sexual Activity  . Alcohol use: No  . Drug use: No  . Sexual activity: Not on file  Other Topics Concern  . Not on file  Social History Narrative  . Not on file   Social Determinants of Health   Financial Resource Strain:   . Difficulty of Paying Living Expenses: Not on file  Food Insecurity:   . Worried About Programme researcher, broadcasting/film/video in the Last Year: Not on file  . Ran Out of Food in the Last Year: Not on file  Transportation Needs:   . Lack of Transportation (Medical): Not on file  . Lack of Transportation (Non-Medical): Not on file  Physical Activity:   . Days of Exercise per Week: Not on file  . Minutes of Exercise per Session: Not on file  Stress:   . Feeling of Stress : Not on file  Social Connections:   . Frequency of Communication with Friends and Family: Not on file  . Frequency of Social Gatherings with Friends and Family: Not on file  . Attends Religious Services: Not on file  . Active Member of Clubs or Organizations: Not on file  . Attends Banker Meetings: Not on file  . Marital Status: Not on file     Family History:  The patient's family history includes Cancer in her brother; Diabetes in her sister; Heart attack in her father; Heart disease in her mother.   ROS:   Please see the history of present illness.    ROS All other systems are reviewed and are negative.   PHYSICAL EXAM:   VS:  BP  (!) 150/74   Pulse 82   Ht 5\' 6"  (1.676 m)   Wt 131 lb (59.4 kg)   SpO2 97%   BMI 21.14 kg/m      General: Alert, oriented x3, no distress, very thin and frail appearing several bruises on her forearms and shins Head: no evidence of trauma, PERRL, EOMI, no exophtalmos or lid lag, no myxedema, no xanthelasma; normal ears, nose and oropharynx Neck: normal jugular venous pulsations and no hepatojugular reflux; brisk carotid pulses without delay and no carotid bruits Chest: clear to auscultation, no signs of consolidation by percussion or palpation, normal fremitus, symmetrical and full respiratory excursions Cardiovascular: normal position and quality of the  apical impulse, irregular rhythm, normal first and second heart sounds, 2/6 holosystolic apical murmur, no diastolic murmurs, rubs or gallops Abdomen: no tenderness or distention, no masses by palpation, no abnormal pulsatility or arterial bruits, normal bowel sounds, no hepatosplenomegaly Extremities: no clubbing, cyanosis or edema; 2+ radial, ulnar and brachial pulses bilaterally; 2+ right femoral, posterior tibial and dorsalis pedis pulses; 2+ left femoral, posterior tibial and dorsalis pedis pulses; no subclavian or femoral bruits Neurological: grossly nonfocal Psych: Normal mood and affect   Wt Readings from Last 3 Encounters:  03/09/20 131 lb (59.4 kg)  09/09/19 136 lb 9.6 oz (62 kg)  08/16/18 129 lb 3.2 oz (58.6 kg)      Studies/Labs Reviewed:   EKG:  EKG is not ordered today.   BMET    Component Value Date/Time   NA 138 11/19/2017 2047   K 3.8 11/19/2017 2047   CL 104 11/19/2017 2047   CO2 27 11/19/2017 2047   GLUCOSE 104 (H) 11/19/2017 2047   BUN 22 (H) 11/19/2017 2047   CREATININE 1.07 (H) 11/19/2017 2047   CREATININE 0.87 06/26/2013 1635   CALCIUM 8.9 11/19/2017 2047   GFRNONAA 46 (L) 11/19/2017 2047   GFRAA 53 (L) 11/19/2017 2047   Lipid Panel  No results found for: CHOL, TRIG, HDL, CHOLHDL, VLDL, LDLCALC,  LDLDIRECT  Labs from 10/21/2018 Total cholesterol 131, HDL 60, LDL 61, triglycerides 53 Hemoglobin 12.7, creatinine 0.8, potassium 4.2, TSH 5.2  07/30/2019 Total cholesterol 126, HDL 58, LDL 56, triglycerides 56 Hemoglobin 12.5, creatinine 0.81, potassium 4.2, normal liver function tests, TSH 3.23  ASSESSMENT:    1. Permanent atrial fibrillation (HCC)   2. Essential hypertension   3. Hypercholesterolemia   4. Age-related osteoporosis with current pathological fracture, sequela      PLAN:  In order of problems listed above:  1. Permanent atrial fibrillation: Well rate controlled.  On aspirin rather than anticoagulation due to frequent falls and injuries. CHADSVasc 4(age 59, gender, hypertension), but felt to be high risk for injury and bleeding.  Although she has not had any recent falls she continues to look very frail and unsteady.  She has never had stroke/TIA or other embolic events. 2. HTN: Adequate control.  She has orthostatic hypotension and a systolic blood pressure in the 140-150 range should be tolerated. 3. HLP: All lipid parameters within desirable range on current medications. 4. Osteoporosis/vertebral compression fracture   Medication Adjustments/Labs and Tests Ordered: Current medicines are reviewed at length with the patient today.  Concerns regarding medicines are outlined above.  Medication changes, Labs and Tests ordered today are listed in the Patient Instructions below. Patient Instructions  Medication Instructions:  No changes *If you need a refill on your cardiac medications before your next appointment, please call your pharmacy*   Lab Work: None ordered If you have labs (blood work) drawn today and your tests are completely normal, you will receive your results only by: Marland Kitchen. MyChart Message (if you have MyChart) OR . A paper copy in the mail If you have any lab test that is abnormal or we need to change your treatment, we will call you to review the  results.   Testing/Procedures: None ordered   Follow-Up: At [facility], you and your health needs are our priority.  As part of our continuing mission to provide you with exceptional heart care, we have created designated Provider Care Teams.  These Care Teams include your primary Cardiologist (physician) and Advanced Practice Providers (APPs -  Physician Assistants  and Nurse Practitioners) who all work together to provide you with the care you need, when you need it.  We recommend signing up for the patient portal called "MyChart".  Sign up information is provided on this After Visit Summary.  MyChart is used to connect with patients for Virtual Visits (Telemedicine).  Patients are able to view lab/test results, encounter notes, upcoming appointments, etc.  Non-urgent messages can be sent to your provider as well.   To learn more about what you can do with MyChart, go to [email or web address].    Your next appointment:   12 month(s)  The format for your next appointment:   In Person  Provider:   You may see Thurmon Fair, MD or one of the following Advanced Practice Providers on your designated Care Team:    Azalee Course, PA-C  Micah Flesher, [place] or   Judy Pimple, [place]    Other Instructions None  Signed, Thurmon Fair, MD  03/14/2020 9:57 AM    [facility] [address] [place], [place], [place]  [address] Phone: [phone]; Fax: [phone]

## 2020-03-09 NOTE — Patient Instructions (Signed)
Medication Instructions:  No changes *If you need a refill on your cardiac medications before your next appointment, please call your pharmacy*   Lab Work: None ordered If you have labs (blood work) drawn today and your tests are completely normal, you will receive your results only by: . MyChart Message (if you have MyChart) OR . A paper copy in the mail If you have any lab test that is abnormal or we need to change your treatment, we will call you to review the results.   Testing/Procedures: None ordered   Follow-Up: At [facility], you and your health needs are our priority.  As part of our continuing mission to provide you with exceptional heart care, we have created designated Provider Care Teams.  These Care Teams include your primary Cardiologist (physician) and Advanced Practice Providers (APPs -  Physician Assistants and Nurse Practitioners) who all work together to provide you with the care you need, when you need it.  We recommend signing up for the patient portal called "MyChart".  Sign up information is provided on this After Visit Summary.  MyChart is used to connect with patients for Virtual Visits (Telemedicine).  Patients are able to view lab/test results, encounter notes, upcoming appointments, etc.  Non-urgent messages can be sent to your provider as well.   To learn more about what you can do with MyChart, go to [email or web address].    Your next appointment:   12 month(s)  The format for your next appointment:   In Person  Provider:   You may see Mihai Croitoru, MD or one of the following Advanced Practice Providers on your designated Care Team:    Hao Meng, PA-C  Angela Duke, PA-C or   Krista Kroeger, PA-C    Other Instructions None  

## 2020-03-12 DIAGNOSIS — I4891 Unspecified atrial fibrillation: Secondary | ICD-10-CM | POA: Diagnosis not present

## 2020-03-12 DIAGNOSIS — E7800 Pure hypercholesterolemia, unspecified: Secondary | ICD-10-CM | POA: Diagnosis not present

## 2020-03-12 DIAGNOSIS — F411 Generalized anxiety disorder: Secondary | ICD-10-CM | POA: Diagnosis not present

## 2020-03-12 DIAGNOSIS — I10 Essential (primary) hypertension: Secondary | ICD-10-CM | POA: Diagnosis not present

## 2020-03-14 ENCOUNTER — Encounter: Payer: Self-pay | Admitting: Cardiovascular Disease

## 2020-04-20 ENCOUNTER — Other Ambulatory Visit: Payer: Self-pay | Admitting: Cardiovascular Disease

## 2020-05-31 ENCOUNTER — Other Ambulatory Visit: Payer: Self-pay

## 2020-05-31 ENCOUNTER — Other Ambulatory Visit: Payer: Self-pay | Admitting: Cardiovascular Disease

## 2020-07-14 ENCOUNTER — Other Ambulatory Visit: Payer: Self-pay | Admitting: Cardiovascular Disease

## 2020-07-30 DIAGNOSIS — E7800 Pure hypercholesterolemia, unspecified: Secondary | ICD-10-CM | POA: Diagnosis not present

## 2020-07-30 DIAGNOSIS — I4891 Unspecified atrial fibrillation: Secondary | ICD-10-CM | POA: Diagnosis not present

## 2020-07-30 DIAGNOSIS — I10 Essential (primary) hypertension: Secondary | ICD-10-CM | POA: Diagnosis not present

## 2020-07-30 DIAGNOSIS — Z0000 Encounter for general adult medical examination without abnormal findings: Secondary | ICD-10-CM | POA: Diagnosis not present

## 2020-09-01 ENCOUNTER — Other Ambulatory Visit: Payer: Self-pay | Admitting: Cardiovascular Disease

## 2020-10-13 ENCOUNTER — Other Ambulatory Visit: Payer: Self-pay | Admitting: Cardiovascular Disease

## 2020-10-14 NOTE — Telephone Encounter (Signed)
Rx(s) sent to pharmacy electronically.  

## 2021-01-11 ENCOUNTER — Other Ambulatory Visit: Payer: Self-pay | Admitting: Cardiovascular Disease

## 2021-01-27 DIAGNOSIS — E7800 Pure hypercholesterolemia, unspecified: Secondary | ICD-10-CM | POA: Diagnosis not present

## 2021-01-27 DIAGNOSIS — I4891 Unspecified atrial fibrillation: Secondary | ICD-10-CM | POA: Diagnosis not present

## 2021-01-27 DIAGNOSIS — I10 Essential (primary) hypertension: Secondary | ICD-10-CM | POA: Diagnosis not present

## 2021-01-27 DIAGNOSIS — F411 Generalized anxiety disorder: Secondary | ICD-10-CM | POA: Diagnosis not present

## 2021-03-02 ENCOUNTER — Other Ambulatory Visit: Payer: Self-pay | Admitting: Cardiovascular Disease

## 2021-03-30 ENCOUNTER — Other Ambulatory Visit: Payer: Self-pay

## 2021-03-30 ENCOUNTER — Encounter: Payer: Self-pay | Admitting: Cardiovascular Disease

## 2021-03-30 ENCOUNTER — Ambulatory Visit: Payer: Medicare Other | Admitting: Cardiovascular Disease

## 2021-03-30 VITALS — BP 162/94 | HR 65 | Ht 66.0 in | Wt 128.8 lb

## 2021-03-30 DIAGNOSIS — Z23 Encounter for immunization: Secondary | ICD-10-CM | POA: Diagnosis not present

## 2021-03-30 DIAGNOSIS — I4821 Permanent atrial fibrillation: Secondary | ICD-10-CM | POA: Diagnosis not present

## 2021-03-30 DIAGNOSIS — I10 Essential (primary) hypertension: Secondary | ICD-10-CM

## 2021-03-30 DIAGNOSIS — E7800 Pure hypercholesterolemia, unspecified: Secondary | ICD-10-CM

## 2021-03-30 NOTE — Progress Notes (Signed)
Patient ID: Rebecca Bradshaw, female   DOB: 1930/08/08, 87 y.o.   MRN: [id number]    Cardiology Office Note    Date:  03/31/2021   ID:  Rebecca Bradshaw, DOB Jan 18, 1931, MRN [id number]  PCP:  Johny Blamer, MD  Cardiologist:   Thurmon Fair, MD   No chief complaint on file.   History of Present Illness:  Rebecca Bradshaw is a 87 y.o. female with permanent atrial fibrillation and HTN. Although she is still living independently, she remains quite unsteady and prone to falls. For this reason, she is not on anticoagulation any longer.   This problems remain her unsteady gait and chronic back pain since vertebral body fracture from her fall a couple of years ago.  She remains borderline underweight with a BMI just under 21, but has not lost any extra weight.  She does not have any cardiovascular complaints.  The patient specifically denies any chest pain at rest exertion, dyspnea at rest or with exertion, orthopnea, paroxysmal nocturnal dyspnea, syncope, palpitations, focal neurological deficits, intermittent claudication, lower extremity edema, unexplained weight gain, cough, hemoptysis or wheezing.  By previous echocardiography she has borderline mitral valve prolapse with moderate mitral regurgitation and moderate biatrial dilatation. She has normal left ventricular systolic function and had a normal myocardial perfusion study several years ago.  Past Medical History:  Diagnosis Date   A-fib [facility])    Arrhythmia 2014   chronic/permanent atrial fibrillation -rate control with carvedilol and anticoag wth warfarin   Chronic diarrhea    Essential hypertension 06/30/2013   History of diverticulitis    Hyperlipidemia    on statin therapy    Past Surgical History:  Procedure Laterality Date   abdominal  aorta doppler  10/17/2007   normal taper--normal duplex   DOPPLER ECHOCARDIOGRAPHY  10/17/2007   EF=>55%; -LA -moderated diltaed; RA-moderately; borderline mitral valve prolapse of AMVL-  moderate mitral regurg.; moderatetricuspid regurg.--rgt ventricular systolic pressure elevated 30-40 mmHg   NM MYOVIEW LTD  06/21/2011   EF 78%  LV systolic function normal - NORMAL STUDY   TRANSESOPHAGEAL ECHOCARDIOGRAM  12/10/2007   normal left ventricle ; normal aortic valve there was trivial  valvular regurg; left atium markedly dilated no evidence for atrial thrombus,; mild to moderate tricuspid valvular regurg. , right atrium markedly dilated    Outpatient Medications Prior to Visit  Medication Sig Dispense Refill   aspirin 81 MG tablet Take 1 tablet (81 mg total) by mouth daily. 30 tablet 6   calcium-vitamin D (OSCAL WITH D) 500-200 MG-UNIT per tablet Take 1 tablet by mouth daily.     carvedilol (COREG) 25 MG tablet TAKE 1 TABLET BY MOUTH TWICE DAILY WITH A MEAL 180 tablet 3   diazepam (VALIUM) 5 MG tablet Take 2.5 mg by mouth 2 (two) times daily. Take 0.5 tablet (2.5 mg total) by mouth every morning and take 1 tablet (5 mg total) by mouth every evening.  0   meclizine (ANTIVERT) 25 MG tablet Take 25 mg by mouth 3 (three) times daily as needed for dizziness.     Multiple Vitamins-Minerals (PRESERVISION AREDS PO) Take by mouth.     pravastatin (PRAVACHOL) 40 MG tablet Take 1 tablet (40 mg total) by mouth daily. 30 tablet 3   senna-docusate (SENOKOT-S) 8.6-50 MG tablet Take 1 tablet by mouth at bedtime.     acetaminophen (TYLENOL) 325 MG tablet Take 650 mg by mouth daily as needed for mild pain. (Patient not taking: Reported on 03/30/2021)  Cholecalciferol (VITAMIN D PO) Take 1 Units by mouth daily.      diazepam (VALIUM) 10 MG tablet Take 5 mg by mouth daily. (Patient not taking: Reported on 03/30/2021)     ondansetron (ZOFRAN-ODT) 4 MG disintegrating tablet Take 1 tablet by mouth every 8 (eight) hours as needed. (Patient not taking: Reported on 03/30/2021)     Polyvinyl Alcohol-Povidone (REFRESH OP) Place 2 drops into both eyes 2 (two) times daily as needed (DRY EYES).  (Patient not  taking: Reported on 03/30/2021)     solifenacin (VESICARE) 5 MG tablet Take 5 mg by mouth at bedtime as needed.     traMADol (ULTRAM) 50 MG tablet Take 1 tablet (50 mg total) by mouth every 6 (six) hours as needed. (Patient not taking: Reported on 03/30/2021) 15 tablet 0   No facility-administered medications prior to visit.     Allergies:   Codeine, Metoprolol, Morphine and related, and Pantoprazole   Social History   Socioeconomic History   Marital status: Widowed    Spouse name: Not on file   Number of children: Not on file   Years of education: Not on file   Highest education level: Not on file  Occupational History   Not on file  Tobacco Use   Smoking status: Never   Smokeless tobacco: Never  Vaping Use   Vaping Use: Never used  Substance and Sexual Activity   Alcohol use: No   Drug use: No   Sexual activity: Not on file  Other Topics Concern   Not on file  Social History Narrative   Not on file   Social Determinants of Health   Financial Resource Strain: Not on file  Food Insecurity: Not on file  Transportation Needs: Not on file  Physical Activity: Not on file  Stress: Not on file  Social Connections: Not on file     Family History:  The patient's family history includes Cancer in her brother; Diabetes in her sister; Heart attack in her father; Heart disease in her mother.   ROS:   Please see the history of present illness.    ROS All other systems are reviewed and are negative.   PHYSICAL EXAM:   VS:  BP (!) 162/94 (BP Location: Left Arm, Patient Position: Sitting, Cuff Size: Normal)   Pulse 65   Ht 5\' 6"  (1.676 m)   Wt 128 lb 12.8 oz (58.4 kg)   SpO2 96%   BMI 20.79 kg/m      General: Alert, oriented x3, no distress, appears very slender, elderly and frail Head: no evidence of trauma, PERRL, EOMI, no exophtalmos or lid lag, no myxedema, no xanthelasma; normal ears, nose and oropharynx Neck: normal jugular venous pulsations and no hepatojugular  reflux; brisk carotid pulses without delay and no carotid bruits Chest: clear to auscultation, no signs of consolidation by percussion or palpation, normal fremitus, symmetrical and full respiratory excursions Cardiovascular: normal position and quality of the apical impulse, irregular rhythm, normal first and second heart sounds, intermittent systolic click, short late systolic apical murmur, no diastolic murmurs, rubs or gallops Abdomen: no tenderness or distention, no masses by palpation, no abnormal pulsatility or arterial bruits, normal bowel sounds, no hepatosplenomegaly Extremities: no clubbing, cyanosis or edema; 2+ radial, ulnar and brachial pulses bilaterally; 2+ right femoral, posterior tibial and dorsalis pedis pulses; 2+ left femoral, posterior tibial and dorsalis pedis pulses; no subclavian or femoral bruits Neurological: grossly nonfocal Psych: Normal mood and affect    Wt Readings from  Last 3 Encounters:  03/30/21 128 lb 12.8 oz (58.4 kg)  03/09/20 131 lb (59.4 kg)  09/09/19 136 lb 9.6 oz (62 kg)      Studies/Labs Reviewed:   EKG:  EKG is not ordered today.   BMET    Component Value Date/Time   NA 138 11/19/2017 2047   K 3.8 11/19/2017 2047   CL 104 11/19/2017 2047   CO2 27 11/19/2017 2047   GLUCOSE 104 (H) 11/19/2017 2047   BUN 22 (H) 11/19/2017 2047   CREATININE 1.07 (H) 11/19/2017 2047   CREATININE 0.87 06/26/2013 1635   CALCIUM 8.9 11/19/2017 2047   GFRNONAA 46 (L) 11/19/2017 2047   GFRAA 53 (L) 11/19/2017 2047   Lipid Panel  No results found for: CHOL, TRIG, HDL, CHOLHDL, VLDL, LDLCALC, LDLDIRECT  Labs from 10/21/2018 Total cholesterol 131, HDL 60, LDL 61, triglycerides 53 Hemoglobin 12.7, creatinine 0.8, potassium 4.2, TSH 5.2  07/30/2019 Total cholesterol 126, HDL 58, LDL 56, triglycerides 56 Hemoglobin 12.5, creatinine 0.81, potassium 4.2, normal liver function tests, TSH 3.23  07/30/2020 Cholesterol 133, HDL 65, LDL 55, triglycerides  59 Hemoglobin 12.5, TSH 3.71  01/27/2021 Creatinine 0.82, potassium 4.0  ASSESSMENT:    1. Permanent atrial fibrillation (HCC)   2. Essential hypertension   3. Hypercholesterolemia   4. Need for immunization against influenza      PLAN:  In order of problems listed above:  Permanent atrial fibrillation: Very well controlled.  Not on anticoagulation due to frequent falls with serious injury. CHADSVasc 4(age 31, gender, hypertension), but felt to be high risk for injury and bleeding.  No history of stroke/TIA or other embolic events. HTN: Blood pressure is high today, but she reports being in a lot of pain.  At her most recent office visit her blood pressure was 134/80.  Serious problems with orthostatic hypotension in the past.  Asked her to keep an eye on her blood pressure at home and call if her systolic blood pressure is consistently higher than 150, but otherwise would not augment antihypertensive therapy. HLP: All parameters are within desirable range on pravastatin. Osteoporosis/vertebral compression fracture with chronic back pain At her request, provided influenza vaccination today (high-dose for age over 61)   Medication Adjustments/Labs and Tests Ordered: Current medicines are reviewed at length with the patient today.  Concerns regarding medicines are outlined above.  Medication changes, Labs and Tests ordered today are listed in the Patient Instructions below. Patient Instructions  Medication Instructions:  No changes *If you need a refill on your cardiac medications before your next appointment, please call your pharmacy*   Lab Work: None ordered If you have labs (blood work) drawn today and your tests are completely normal, you will receive your results only by: MyChart Message (if you have MyChart) OR A paper copy in the mail If you have any lab test that is abnormal or we need to change your treatment, we will call you to review the  results.   Testing/Procedures: None ordered   Follow-Up: At [facility], you and your health needs are our priority.  As part of our continuing mission to provide you with exceptional heart care, we have created designated Provider Care Teams.  These Care Teams include your primary Cardiologist (physician) and Advanced Practice Providers (APPs -  Physician Assistants and Nurse Practitioners) who all work together to provide you with the care you need, when you need it.  We recommend signing up for the patient portal called "MyChart".  Sign up  information is provided on this After Visit Summary.  MyChart is used to connect with patients for Virtual Visits (Telemedicine).  Patients are able to view lab/test results, encounter notes, upcoming appointments, etc.  Non-urgent messages can be sent to your provider as well.   To learn more about what you can do with MyChart, go to [email or web address].    Your next appointment:   12 month(s)  The format for your next appointment:   In Person  Provider:   You may see Thurmon Fair, MD or one of the following Advanced Practice Providers on your designated Care Team:   Azalee Course, PA-C Micah Flesher, [place] or  Judy Pimple, PA-C Signed, Thurmon Fair, MD  03/31/2021 2:03 PM    [facility] [address] [place], [place], [place]  [address] Phone: [phone]; Fax: [phone]

## 2021-03-30 NOTE — Patient Instructions (Signed)

## 2021-06-23 DIAGNOSIS — H40013 Open angle with borderline findings, low risk, bilateral: Secondary | ICD-10-CM | POA: Diagnosis not present

## 2021-06-23 DIAGNOSIS — H353131 Nonexudative age-related macular degeneration, bilateral, early dry stage: Secondary | ICD-10-CM | POA: Diagnosis not present

## 2021-06-23 DIAGNOSIS — R7309 Other abnormal glucose: Secondary | ICD-10-CM | POA: Diagnosis not present

## 2021-06-23 DIAGNOSIS — H04123 Dry eye syndrome of bilateral lacrimal glands: Secondary | ICD-10-CM | POA: Diagnosis not present

## 2021-08-08 DIAGNOSIS — E7800 Pure hypercholesterolemia, unspecified: Secondary | ICD-10-CM | POA: Diagnosis not present

## 2021-08-08 DIAGNOSIS — I4891 Unspecified atrial fibrillation: Secondary | ICD-10-CM | POA: Diagnosis not present

## 2021-08-08 DIAGNOSIS — Z0000 Encounter for general adult medical examination without abnormal findings: Secondary | ICD-10-CM | POA: Diagnosis not present

## 2021-08-08 DIAGNOSIS — I10 Essential (primary) hypertension: Secondary | ICD-10-CM | POA: Diagnosis not present

## 2021-09-28 DIAGNOSIS — H6123 Impacted cerumen, bilateral: Secondary | ICD-10-CM | POA: Diagnosis not present

## 2021-10-14 DIAGNOSIS — H9221 Otorrhagia, right ear: Secondary | ICD-10-CM | POA: Diagnosis not present

## 2022-01-26 DIAGNOSIS — M79604 Pain in right leg: Secondary | ICD-10-CM | POA: Diagnosis not present

## 2022-01-26 DIAGNOSIS — K219 Gastro-esophageal reflux disease without esophagitis: Secondary | ICD-10-CM | POA: Diagnosis not present

## 2022-01-26 DIAGNOSIS — L309 Dermatitis, unspecified: Secondary | ICD-10-CM | POA: Diagnosis not present

## 2022-01-26 DIAGNOSIS — I8390 Asymptomatic varicose veins of unspecified lower extremity: Secondary | ICD-10-CM | POA: Diagnosis not present

## 2022-01-27 DIAGNOSIS — R2241 Localized swelling, mass and lump, right lower limb: Secondary | ICD-10-CM | POA: Diagnosis not present

## 2022-05-11 ENCOUNTER — Encounter: Payer: Self-pay | Admitting: Cardiovascular Disease

## 2022-05-11 ENCOUNTER — Ambulatory Visit: Payer: Medicare Other | Attending: Cardiovascular Disease | Admitting: Cardiovascular Disease

## 2022-05-11 VITALS — BP 139/79 | HR 59 | Ht 66.0 in | Wt 126.6 lb

## 2022-05-11 DIAGNOSIS — I4821 Permanent atrial fibrillation: Secondary | ICD-10-CM | POA: Diagnosis not present

## 2022-05-11 DIAGNOSIS — I10 Essential (primary) hypertension: Secondary | ICD-10-CM

## 2022-05-11 DIAGNOSIS — E7800 Pure hypercholesterolemia, unspecified: Secondary | ICD-10-CM | POA: Diagnosis not present

## 2022-05-11 DIAGNOSIS — M810 Age-related osteoporosis without current pathological fracture: Secondary | ICD-10-CM

## 2022-05-11 NOTE — Progress Notes (Signed)
Patient ID: Rebecca Bradshaw, female   DOB: February 17, 1931, 88 y.o.   MRN: [id number]    Cardiology Office Note    Date:  05/11/2022   ID:  Rebecca Bradshaw, DOB February 17, 1931, MRN [id number]  PCP:  Johny BlamerHarris, William, MD  Cardiologist:   Thurmon FairMihai Lasharn Bufkin, MD   Chief Complaint  Patient presents with   Atrial Fibrillation    History of Present Illness:  Rebecca Bradshaw is a 88 y.o. female with permanent atrial fibrillation and HTN. Although she is still living independently, she remains quite unsteady and prone to falls. For this reason, she is not on anticoagulation any longer.   This problems remain her unsteady gait and chronic back pain since vertebral body fracture from a fall.  She remains borderline underweight with a BMI just under 21, but has not lost any extra weight.  She does not have any cardiovascular complaints.  The patient specifically denies any chest pain at rest exertion, dyspnea at rest or with exertion, orthopnea, paroxysmal nocturnal dyspnea, syncope, palpitations, intermittent claudication, lower extremity edema, unexplained weight gain, cough, hemoptysis or wheezing.  Fortunately she has not had any new falls.  She continues to bruise very easily and bleed if she has the slightest injury.  She had an episode of bad vertigo that lasted for about 24 hours a few weeks ago.  She has not had any focal neurological events to suggest stroke/TIA such as aphasia, facial droop, unilateral loss of hand or leg strength, etc.  Her blood pressure was a little high today, but her home blood pressure cuff, which we checked in the office today, records blood pressure typically in the low 130s/mid 70s at home.  Compared to our wall sphygmomanometer, her blood pressure cuff seems to exaggerate her blood pressure by about 5-10 mmHg.  By previous echocardiography she has borderline mitral valve prolapse with moderate mitral regurgitation and moderate biatrial dilatation. She has normal left ventricular  systolic function and had a normal myocardial perfusion study several years ago.  Past Medical History:  Diagnosis Date   A-fib [facility](HCC)    Arrhythmia 2014   chronic/permanent atrial fibrillation -rate control with carvedilol and anticoag wth warfarin   Chronic diarrhea    Essential hypertension 06/30/2013   History of diverticulitis    Hyperlipidemia    on statin therapy    Past Surgical History:  Procedure Laterality Date   abdominal  aorta doppler  10/17/2007   normal taper--normal duplex   DOPPLER ECHOCARDIOGRAPHY  10/17/2007   EF=>55%; -LA -moderated diltaed; RA-moderately; borderline mitral valve prolapse of AMVL- moderate mitral regurg.; moderatetricuspid regurg.--rgt ventricular systolic pressure elevated 30-40 mmHg   NM MYOVIEW LTD  06/21/2011   EF 78%  LV systolic function normal - NORMAL STUDY   TRANSESOPHAGEAL ECHOCARDIOGRAM  12/10/2007   normal left ventricle ; normal aortic valve there was trivial  valvular regurg; left atium markedly dilated no evidence for atrial thrombus,; mild to moderate tricuspid valvular regurg. , right atrium markedly dilated    Outpatient Medications Prior to Visit  Medication Sig Dispense Refill   acetaminophen (TYLENOL) 325 MG tablet Take 650 mg by mouth daily as needed for mild pain.     aspirin 81 MG tablet Take 1 tablet (81 mg total) by mouth daily. 30 tablet 6   calcium-vitamin D (OSCAL WITH D) 500-200 MG-UNIT per tablet Take 1 tablet by mouth daily.     carvedilol (COREG) 25 MG tablet TAKE 1 TABLET BY MOUTH TWICE DAILY WITH A MEAL  180 tablet 3   Cholecalciferol (VITAMIN D PO) Take 1 Units by mouth daily.      diazepam (VALIUM) 5 MG tablet Take 2.5 mg by mouth 2 (two) times daily. Take 0.5 tablet (2.5 mg total) by mouth every morning and take 1 tablet (5 mg total) by mouth every evening.  0   meclizine (ANTIVERT) 25 MG tablet Take 25 mg by mouth 3 (three) times daily as needed for dizziness.     Multiple Vitamins-Minerals (PRESERVISION AREDS  PO) Take by mouth.     pravastatin (PRAVACHOL) 40 MG tablet Take 1 tablet (40 mg total) by mouth daily. 30 tablet 3   senna-docusate (SENOKOT-S) 8.6-50 MG tablet Take 1 tablet by mouth at bedtime.     ondansetron (ZOFRAN-ODT) 4 MG disintegrating tablet Take 1 tablet by mouth every 8 (eight) hours as needed. (Patient not taking: Reported on 05/11/2022)     Polyvinyl Alcohol-Povidone (REFRESH OP) Place 2 drops into both eyes 2 (two) times daily as needed (DRY EYES).  (Patient not taking: Reported on 05/11/2022)     traMADol (ULTRAM) 50 MG tablet Take 1 tablet (50 mg total) by mouth every 6 (six) hours as needed. (Patient not taking: Reported on 05/11/2022) 15 tablet 0   diazepam (VALIUM) 10 MG tablet Take 5 mg by mouth daily. (Patient not taking: Reported on 03/30/2021)     solifenacin (VESICARE) 5 MG tablet Take 5 mg by mouth at bedtime as needed.     No facility-administered medications prior to visit.     Allergies:   Codeine, Metoprolol, Morphine and related, and Pantoprazole   Social History   Socioeconomic History   Marital status: Widowed    Spouse name: Not on file   Number of children: Not on file   Years of education: Not on file   Highest education level: Not on file  Occupational History   Not on file  Tobacco Use   Smoking status: Never   Smokeless tobacco: Never  Vaping Use   Vaping Use: Never used  Substance and Sexual Activity   Alcohol use: No   Drug use: No   Sexual activity: Not on file  Other Topics Concern   Not on file  Social History Narrative   Not on file   Social Determinants of Health   Financial Resource Strain: Not on file  Food Insecurity: Not on file  Transportation Needs: Not on file  Physical Activity: Not on file  Stress: Not on file  Social Connections: Not on file     Family History:  The patient's family history includes Cancer in her brother; Diabetes in her sister; Heart attack in her father; Heart disease in her mother.   ROS:    Please see the history of present illness.    ROS All other systems are reviewed and are negative.   PHYSICAL EXAM:   VS:  BP 139/79   Pulse (!) 59   Ht 5\' 6"  (1.676 m)   Wt 57.4 kg   SpO2 98%   BMI 20.43 kg/m     .mc heal General: Alert, oriented x3, no distress, very slender, elderly, frail Head: no evidence of trauma, PERRL, EOMI, no exophtalmos or lid lag, no myxedema, no xanthelasma; normal ears, nose and oropharynx Neck: normal jugular venous pulsations and no hepatojugular reflux; brisk carotid pulses without delay and no carotid bruits Chest: clear to auscultation, no signs of consolidation by percussion or palpation, normal fremitus, symmetrical and full respiratory excursions Cardiovascular: normal position  and quality of the apical impulse, irregular rhythm, normal first and second heart sounds, no murmurs, rubs or gallops.  There is an intermittent systolic click Abdomen: no tenderness or distention, no masses by palpation, no abnormal pulsatility or arterial bruits, normal bowel sounds, no hepatosplenomegaly Extremities: no clubbing, cyanosis or edema; 2+ radial, ulnar and brachial pulses bilaterally; 2+ right femoral, posterior tibial and dorsalis pedis pulses; 2+ left femoral, posterior tibial and dorsalis pedis pulses; no subclavian or femoral bruits Neurological: Resting tremor of the head and hands Psych: Normal mood and affect   Wt Readings from Last 3 Encounters:  05/11/22 57.4 kg  03/30/21 58.4 kg  03/09/20 59.4 kg      Studies/Labs Reviewed:   EKG:  EKG is ordered today.  Shows atrial fibrillation, low voltage  BMET    Component Value Date/Time   NA 138 11/19/2017 2047   K 3.8 11/19/2017 2047   CL 104 11/19/2017 2047   CO2 27 11/19/2017 2047   GLUCOSE 104 (H) 11/19/2017 2047   BUN 22 (H) 11/19/2017 2047   CREATININE 1.07 (H) 11/19/2017 2047   CREATININE 0.87 06/26/2013 1635   CALCIUM 8.9 11/19/2017 2047   GFRNONAA 46 (L) 11/19/2017 2047   GFRAA  53 (L) 11/19/2017 2047   Lipid Panel  No results found for: "CHOL", "TRIG", "HDL", "CHOLHDL", "VLDL", "LDLCALC", "LDLDIRECT"  Labs from 10/21/2018 Total cholesterol 131, HDL 60, LDL 61, triglycerides 53 Hemoglobin 12.7, creatinine 0.8, potassium 4.2, TSH 5.2  07/30/2019 Total cholesterol 126, HDL 58, LDL 56, triglycerides 56 Hemoglobin 12.5, creatinine 0.81, potassium 4.2, normal liver function tests, TSH 3.23  07/30/2020 Cholesterol 133, HDL 65, LDL 55, triglycerides 59 Hemoglobin 12.5, TSH 3.71  01/27/2021 Creatinine 0.82, potassium 4.0  07/19/2021 Cholesterol 138, HDL 61, LDL 65, triglycerides 62 Hemoglobin 12.7, TSH 2.71, creatinine 0.8, potassium 4.4, ALT 12   ASSESSMENT:    1. Permanent atrial fibrillation (HCC)   2. Essential hypertension   3. Hypercholesterolemia   4. Age-related osteoporosis without current pathological fracture      PLAN:  In order of problems listed above:  Permanent atrial fibrillation: Well rate controlled on beta-blocker.  Not on anticoagulation due to frequent falls with serious injury. CHADSVasc 4(age 84, gender, hypertension), but felt to be high risk for injury and bleeding.  No history of stroke/TIA or other embolic events. HTN: Her blood pressure at home is typically in the 130s/70s and she has had serious problems with orthostatic hypotension in the past.  Tolerate systolic blood pressure up to 510. HLP: Excellent lipid parameters on pravastatin. Osteoporosis/vertebral compression fracture with chronic back pain  Medication Adjustments/Labs and Tests Ordered: Current medicines are reviewed at length with the patient today.  Concerns regarding medicines are outlined above.  Medication changes, Labs and Tests ordered today are listed in the Patient Instructions below. Patient Instructions  Medication Instructions:  Your physician recommends that you continue on your current medications as directed. Please refer to the Current Medication  list given to you today.  *If you need a refill on your cardiac medications before your next appointment, please call your pharmacy*  Lab Work: NONE ordered at this time of appointment   If you have labs (blood work) drawn today and your tests are completely normal, you will receive your results only by: MyChart Message (if you have MyChart) OR A paper copy in the mail If you have any lab test that is abnormal or we need to change your treatment, we will call you to review  the results.  Testing/Procedures: NONE ordered at this time of appointment   Follow-Up: At [facility], you and your health needs are our priority.  As part of our continuing mission to provide you with exceptional heart care, we have created designated Provider Care Teams.  These Care Teams include your primary Cardiologist (physician) and Advanced Practice Providers (APPs -  Physician Assistants and Nurse Practitioners) who all work together to provide you with the care you need, when you need it.  We recommend signing up for the patient portal called "MyChart".  Sign up information is provided on this After Visit Summary.  MyChart is used to connect with patients for Virtual Visits (Telemedicine).  Patients are able to view lab/test results, encounter notes, upcoming appointments, etc.  Non-urgent messages can be sent to your provider as well.   To learn more about what you can do with MyChart, go to [email or web address].    Your next appointment:   1 year(s)  The format for your next appointment:   In Person  Provider:   Thurmon Fair, MD     Other Instructions   Important Information About Sugar      Signed, Thurmon Fair, MD  05/11/2022 10:40 AM    [facility] [address] [place], [place], [place]  [address] Phone: [phone]; Fax: [phone]

## 2022-05-11 NOTE — Patient Instructions (Addendum)
Medication Instructions:   Your physician recommends that you continue on your current medications as directed. Please refer to the Current Medication list given to you today.  *If you need a refill on your cardiac medications before your next appointment, please call your pharmacy*  Lab Work: NONE ordered at this time of appointment   If you have labs (blood work) drawn today and your tests are completely normal, you will receive your results only by: MyChart Message (if you have MyChart) OR A paper copy in the mail If you have any lab test that is abnormal or we need to change your treatment, we will call you to review the results.  Testing/Procedures: NONE ordered at this time of appointment   Follow-Up: At [facility], you and your health needs are our priority.  As part of our continuing mission to provide you with exceptional heart care, we have created designated Provider Care Teams.  These Care Teams include your primary Cardiologist (physician) and Advanced Practice Providers (APPs -  Physician Assistants and Nurse Practitioners) who all work together to provide you with the care you need, when you need it.  We recommend signing up for the patient portal called "MyChart".  Sign up information is provided on this After Visit Summary.  MyChart is used to connect with patients for Virtual Visits (Telemedicine).  Patients are able to view lab/test results, encounter notes, upcoming appointments, etc.  Non-urgent messages can be sent to your provider as well.   To learn more about what you can do with MyChart, go to [email or web address].    Your next appointment:   1 year(s)  The format for your next appointment:   In Person  Provider:   Mihai Croitoru, MD     Other Instructions  Important Information About Sugar       

## 2022-06-26 DIAGNOSIS — H04123 Dry eye syndrome of bilateral lacrimal glands: Secondary | ICD-10-CM | POA: Diagnosis not present

## 2022-06-26 DIAGNOSIS — H524 Presbyopia: Secondary | ICD-10-CM | POA: Diagnosis not present

## 2022-06-26 DIAGNOSIS — H40013 Open angle with borderline findings, low risk, bilateral: Secondary | ICD-10-CM | POA: Diagnosis not present

## 2022-06-26 DIAGNOSIS — H353131 Nonexudative age-related macular degeneration, bilateral, early dry stage: Secondary | ICD-10-CM | POA: Diagnosis not present

## 2022-06-26 DIAGNOSIS — H35363 Drusen (degenerative) of macula, bilateral: Secondary | ICD-10-CM | POA: Diagnosis not present

## 2022-08-23 DIAGNOSIS — I10 Essential (primary) hypertension: Secondary | ICD-10-CM | POA: Diagnosis not present

## 2022-08-23 DIAGNOSIS — N183 Chronic kidney disease, stage 3 unspecified: Secondary | ICD-10-CM | POA: Diagnosis not present

## 2022-08-23 DIAGNOSIS — I4891 Unspecified atrial fibrillation: Secondary | ICD-10-CM | POA: Diagnosis not present

## 2022-08-23 DIAGNOSIS — Z0000 Encounter for general adult medical examination without abnormal findings: Secondary | ICD-10-CM | POA: Diagnosis not present

## 2023-01-30 DIAGNOSIS — I10 Essential (primary) hypertension: Secondary | ICD-10-CM | POA: Diagnosis not present

## 2023-01-30 DIAGNOSIS — K5900 Constipation, unspecified: Secondary | ICD-10-CM | POA: Diagnosis not present

## 2023-01-30 DIAGNOSIS — R21 Rash and other nonspecific skin eruption: Secondary | ICD-10-CM | POA: Diagnosis not present

## 2023-01-30 DIAGNOSIS — R609 Edema, unspecified: Secondary | ICD-10-CM | POA: Diagnosis not present

## 2023-01-31 ENCOUNTER — Ambulatory Visit: Payer: Medicare Other | Admitting: Student

## 2023-02-26 NOTE — Progress Notes (Unsigned)
Cardiology Clinic Note   Patient Name: Rebecca Bradshaw Date of Encounter: 03/01/2023  Primary Care Provider:  Noberto Retort, MD Primary Cardiologist:  Rebecca Fair, MD  Patient Profile    89 year old female with permanent atrial fibrillation,CHADSVasc 4,  HL, and HTN she remains quite unsteady and prone to falls, with chronic back pain since vertebral body fracture from a fall.  and is no longer on anticoagulation.    Past Medical History    Past Medical History:  Diagnosis Date   A-fib [facility])    Arrhythmia 2014   chronic/permanent atrial fibrillation -rate control with carvedilol and anticoag wth warfarin   Chronic diarrhea    Essential hypertension 06/30/2013   History of diverticulitis    Hyperlipidemia    on statin therapy   Past Surgical History:  Procedure Laterality Date   abdominal  aorta doppler  10/17/2007   normal taper--normal duplex   DOPPLER ECHOCARDIOGRAPHY  10/17/2007   EF=>55%; -LA -moderated diltaed; RA-moderately; borderline mitral valve prolapse of AMVL- moderate mitral regurg.; moderatetricuspid regurg.--rgt ventricular systolic pressure elevated 30-40 mmHg   NM MYOVIEW LTD  06/21/2011   EF 78%  LV systolic function normal - NORMAL STUDY   TRANSESOPHAGEAL ECHOCARDIOGRAM  12/10/2007   normal left ventricle ; normal aortic valve there was trivial  valvular regurg; left atium markedly dilated no evidence for atrial thrombus,; mild to moderate tricuspid valvular regurg. , right atrium markedly dilated    Allergies  Allergies  Allergen Reactions   Codeine Nausea And Vomiting   Metoprolol Other (See Comments)    "Nightmares"   Morphine And Codeine Nausea And Vomiting   Pantoprazole Other (See Comments)    nightmares    History of Present Illness    Rebecca Bradshaw comes today with complaints of shortness of breath, lower extremity edema, PND, generalized fatigue.  She has had worsening lower extremity edema for the last couple of months, worsening frailty,  and chronic arthritic pain in her neck and shoulders.  She has refused any p.o. diuretics, "because it makes me use the bathroom too much and I cannot make it to the bathroom", yet she is having worsening symptoms.  She gets Meals on Wheels at home and therefore she is unable to control salt intake.  Her family is with her and they are seeing her deteriorate further.  Home Medications    Current Outpatient Medications  Medication Sig Dispense Refill   acetaminophen (TYLENOL) 325 MG tablet Take 650 mg by mouth daily as needed for mild pain.     aspirin 81 MG tablet Take 1 tablet (81 mg total) by mouth daily. 30 tablet 6   calcium-vitamin D (OSCAL WITH D) 500-200 MG-UNIT per tablet Take 1 tablet by mouth daily.     carvedilol (COREG) 25 MG tablet TAKE 1 TABLET BY MOUTH TWICE DAILY WITH A MEAL 180 tablet 3   Cholecalciferol (VITAMIN D PO) Take 1 Units by mouth daily.      diazepam (VALIUM) 5 MG tablet Take 2.5 mg by mouth 2 (two) times daily. Take 0.5 tablet (2.5 mg total) by mouth every morning and take 1 tablet (5 mg total) by mouth every evening.  0   meclizine (ANTIVERT) 25 MG tablet Take 25 mg by mouth 3 (three) times daily as needed for dizziness.     Multiple Vitamins-Minerals (PRESERVISION AREDS PO) Take by mouth.     ofloxacin (FLOXIN) 0.3 % OTIC solution Place 5 drops in ear(s) 2 (two) times daily.  ondansetron (ZOFRAN-ODT) 4 MG disintegrating tablet Take 1 tablet by mouth every 8 (eight) hours as needed.     Polyvinyl Alcohol-Povidone (REFRESH OP) Place 2 drops into both eyes 2 (two) times daily as needed (DRY EYES).     pravastatin (PRAVACHOL) 40 MG tablet Take 1 tablet (40 mg total) by mouth daily. 30 tablet 3   senna-docusate (SENOKOT-S) 8.6-50 MG tablet Take 1 tablet by mouth at bedtime.     Solifenacin Succinate (VESICARE PO) Take by mouth at bedtime.     traMADol (ULTRAM) 50 MG tablet Take 1 tablet (50 mg total) by mouth every 6 (six) hours as needed. 15 tablet 0   No current  facility-administered medications for this visit.     Family History    Family History  Problem Relation Age of Onset   Heart disease Mother    Heart attack Father    Diabetes Sister    Cancer Brother    She indicated that her mother is deceased. She indicated that her father is deceased. She indicated that her sister is deceased. She indicated that her brother is deceased. She indicated that her daughter is alive. She indicated that her son is alive.  Social History    Social History   Socioeconomic History   Marital status: Widowed    Spouse name: Not on file   Number of children: Not on file   Years of education: Not on file   Highest education level: Not on file  Occupational History   Not on file  Tobacco Use   Smoking status: Never   Smokeless tobacco: Never  Vaping Use   Vaping status: Never Used  Substance and Sexual Activity   Alcohol use: No   Drug use: No   Sexual activity: Not on file  Other Topics Concern   Not on file  Social History Narrative   Not on file   Social Determinants of Health   Financial Resource Strain: Not on file  Food Insecurity: Not on file  Transportation Needs: Not on file  Physical Activity: Not on file  Stress: Not on file  Social Connections: Unknown (01/26/2022)   Received from [facility]   Social Network    Social Network: Not on file  Intimate Partner Violence: Unknown (01/26/2022)   Received from [facility], [facility]   HITS    Physically Hurt: Not on file    Insult or Talk Down To: Not on file    Threaten Physical Harm: Not on file    Scream or Curse: Not on file     Review of Systems    General:  No chills, fever, night sweats or weight changes.  Cardiovascular:  No chest pain, positive for dyspnea on exertion, worsening bilateral lower extremity edema, positive for orthopnea, palpitations, paroxysmal nocturnal dyspnea. Dermatological: No rash, lesions/masses rash on skin near her ears  and lower extremities. Respiratory: No cough, dyspnea Urologic: No hematuria, dysuria Abdominal:   No nausea, vomiting, diarrhea, bright red blood per rectum, melena, or hematemesis Neurologic:  No visual changes, wkns, changes in mental status. All other systems reviewed and are otherwise negative except as noted above.       Physical Exam    VS:  Ht 5\' 3"  (1.6 m)   Wt 128 lb 6.4 oz (58.2 kg)   SpO2 94%   BMI 22.75 kg/m  , BMI Body mass index is 22.75 kg/m.     GEN: Well nourished, well developed, in no acute  distress. HEENT: normal. Neck: Supple, positive for JVD, positive for HJR, carotid bruits, or masses. Cardiac: IRRR, no murmurs, rubs, or gallops. No clubbing, cyanosis, 2+ to 3+ pitting lower extremities edema.  Radials/DP/PT 2+ and equal bilaterally.  Respiratory:  Respirations regular and unlabored, inspiratory wheezes, with bilateral crackles at the bases,  Soft, nontender, nondistended, BS + x 4. MS: no deformity or atrophy.  Kyphosis is prominent. Skin: warm and dry, no rash. Neuro:  Strength and sensation are intact. Psych: Normal affect.  Easily tearful.      Lab Results  Component Value Date   WBC 5.2 11/19/2017   HGB 11.7 (L) 11/19/2017   HCT 36.4 11/19/2017   MCV 87.1 11/19/2017   PLT 155 11/19/2017   Lab Results  Component Value Date   CREATININE 1.07 (H) 11/19/2017   BUN 22 (H) 11/19/2017   NA 138 11/19/2017   K 3.8 11/19/2017   CL 104 11/19/2017   CO2 27 11/19/2017   Lab Results  Component Value Date   ALT 12 04/07/2014   AST 18 04/07/2014   ALKPHOS 60 04/07/2014   BILITOT 0.7 04/07/2014   No results found for: "CHOL", "HDL", "LDLCALC", "LDLDIRECT", "TRIG", "CHOLHDL"  No results found for: "HGBA1C"   Review of Prior Studies    Has not had echocardiogram since 2009, Most recent myocardial perfusion study 2013.  Assessment & Plan   1.  Acute on chronic decompensated CHF: Symptomatic with lower extremity edema, dyspnea, fatigue, lower  extremity edema, with prominent JVD.  She lives alone and there is no one available to take care of her there.  I am uncomfortable giving her p.o. diuretics as she is unsteady on her feet and has a history of vertigo and frailty.  I think it is best that she be admitted for IV diuresis and further evaluation with echocardiogram.  Due to frailty and age this should be done slowly with close follow-up concerning I/O's blood pressure, and patient's response to medication.  Will start her on low-dose IV Lasix, monitor labs.  DNR status should be updated.   2.  Atrial fibrillation: She is currently on carvedilol 25 mg twice daily with good heart rate control.  She is not on any anticoagulation due to frailty and frequent falls.  Continue her on aspirin 81 mg daily.   3.  Hyperlipidemia: Remains on pravastatin 40 mg daily.  This can be continued during hospitalization.       I have discussed this patient with Dr. Tresa Endo who is DOD in the office at [facility] today.  He is in agreement with my assessment and plan to send the patient to the ED.  She will likely need to have admission.  Signed, Bettey Mare. Liborio Nixon, ANP, AACC   03/01/2023 1:44 PM      Office [phone] Fax [phone]  Notice: This dictation was prepared with Dragon dictation along with smaller phrase technology. Any transcriptional errors that result from this process are unintentional and may not be corrected upon review.

## 2023-03-01 ENCOUNTER — Other Ambulatory Visit: Payer: Self-pay

## 2023-03-01 ENCOUNTER — Emergency Department (HOSPITAL_COMMUNITY): Payer: Medicare Other

## 2023-03-01 ENCOUNTER — Inpatient Hospital Stay (HOSPITAL_COMMUNITY): Payer: Medicare Other

## 2023-03-01 ENCOUNTER — Encounter (HOSPITAL_COMMUNITY): Payer: Self-pay | Admitting: Family Medicine

## 2023-03-01 ENCOUNTER — Encounter: Payer: Self-pay | Admitting: Adult Health

## 2023-03-01 ENCOUNTER — Ambulatory Visit: Payer: Medicare Other | Attending: Student | Admitting: Adult Health

## 2023-03-01 ENCOUNTER — Inpatient Hospital Stay (HOSPITAL_COMMUNITY)
Admission: EM | Admit: 2023-03-01 | Discharge: 2023-03-07 | DRG: 291 | Disposition: A | Discharge status: Skilled Nursing Facility | Payer: Medicare Other | Attending: Internal Medicine | Admitting: Internal Medicine

## 2023-03-01 VITALS — BP 136/86 | HR 66 | Ht 63.0 in | Wt 128.4 lb

## 2023-03-01 DIAGNOSIS — Z885 Allergy status to narcotic agent status: Secondary | ICD-10-CM | POA: Diagnosis not present

## 2023-03-01 DIAGNOSIS — E7800 Pure hypercholesterolemia, unspecified: Secondary | ICD-10-CM | POA: Diagnosis not present

## 2023-03-01 DIAGNOSIS — Z7982 Long term (current) use of aspirin: Secondary | ICD-10-CM

## 2023-03-01 DIAGNOSIS — I272 Pulmonary hypertension, unspecified: Secondary | ICD-10-CM | POA: Diagnosis not present

## 2023-03-01 DIAGNOSIS — I160 Hypertensive urgency: Secondary | ICD-10-CM | POA: Diagnosis present

## 2023-03-01 DIAGNOSIS — Z23 Encounter for immunization: Secondary | ICD-10-CM

## 2023-03-01 DIAGNOSIS — R1030 Lower abdominal pain, unspecified: Secondary | ICD-10-CM | POA: Diagnosis not present

## 2023-03-01 DIAGNOSIS — F419 Anxiety disorder, unspecified: Secondary | ICD-10-CM | POA: Diagnosis not present

## 2023-03-01 DIAGNOSIS — I4891 Unspecified atrial fibrillation: Secondary | ICD-10-CM | POA: Diagnosis not present

## 2023-03-01 DIAGNOSIS — Z79899 Other long term (current) drug therapy: Secondary | ICD-10-CM

## 2023-03-01 DIAGNOSIS — I10 Essential (primary) hypertension: Secondary | ICD-10-CM

## 2023-03-01 DIAGNOSIS — I517 Cardiomegaly: Secondary | ICD-10-CM | POA: Diagnosis not present

## 2023-03-01 DIAGNOSIS — R54 Age-related physical debility: Secondary | ICD-10-CM | POA: Diagnosis present

## 2023-03-01 DIAGNOSIS — R2681 Unsteadiness on feet: Secondary | ICD-10-CM | POA: Diagnosis not present

## 2023-03-01 DIAGNOSIS — J811 Chronic pulmonary edema: Secondary | ICD-10-CM | POA: Diagnosis not present

## 2023-03-01 DIAGNOSIS — Z888 Allergy status to other drugs, medicaments and biological substances status: Secondary | ICD-10-CM

## 2023-03-01 DIAGNOSIS — M6259 Muscle wasting and atrophy, not elsewhere classified, multiple sites: Secondary | ICD-10-CM | POA: Diagnosis not present

## 2023-03-01 DIAGNOSIS — D61818 Other pancytopenia: Secondary | ICD-10-CM | POA: Diagnosis present

## 2023-03-01 DIAGNOSIS — I5033 Acute on chronic diastolic (congestive) heart failure: Secondary | ICD-10-CM | POA: Diagnosis not present

## 2023-03-01 DIAGNOSIS — Z8249 Family history of ischemic heart disease and other diseases of the circulatory system: Secondary | ICD-10-CM

## 2023-03-01 DIAGNOSIS — Z66 Do not resuscitate: Secondary | ICD-10-CM | POA: Diagnosis present

## 2023-03-01 DIAGNOSIS — I2729 Other secondary pulmonary hypertension: Secondary | ICD-10-CM | POA: Diagnosis present

## 2023-03-01 DIAGNOSIS — R1013 Epigastric pain: Secondary | ICD-10-CM | POA: Diagnosis not present

## 2023-03-01 DIAGNOSIS — K5900 Constipation, unspecified: Secondary | ICD-10-CM | POA: Diagnosis not present

## 2023-03-01 DIAGNOSIS — I4821 Permanent atrial fibrillation: Secondary | ICD-10-CM | POA: Diagnosis not present

## 2023-03-01 DIAGNOSIS — I110 Hypertensive heart disease with heart failure: Principal | ICD-10-CM | POA: Diagnosis present

## 2023-03-01 DIAGNOSIS — E785 Hyperlipidemia, unspecified: Secondary | ICD-10-CM | POA: Diagnosis not present

## 2023-03-01 DIAGNOSIS — E876 Hypokalemia: Secondary | ICD-10-CM | POA: Diagnosis not present

## 2023-03-01 DIAGNOSIS — I5023 Acute on chronic systolic (congestive) heart failure: Secondary | ICD-10-CM

## 2023-03-01 DIAGNOSIS — I2781 Cor pulmonale (chronic): Secondary | ICD-10-CM | POA: Diagnosis not present

## 2023-03-01 DIAGNOSIS — Z7401 Bed confinement status: Secondary | ICD-10-CM | POA: Diagnosis not present

## 2023-03-01 DIAGNOSIS — R6889 Other general symptoms and signs: Secondary | ICD-10-CM | POA: Diagnosis not present

## 2023-03-01 DIAGNOSIS — R262 Difficulty in walking, not elsewhere classified: Secondary | ICD-10-CM | POA: Diagnosis present

## 2023-03-01 DIAGNOSIS — R350 Frequency of micturition: Secondary | ICD-10-CM | POA: Diagnosis not present

## 2023-03-01 DIAGNOSIS — I509 Heart failure, unspecified: Principal | ICD-10-CM

## 2023-03-01 DIAGNOSIS — R609 Edema, unspecified: Secondary | ICD-10-CM | POA: Diagnosis not present

## 2023-03-01 DIAGNOSIS — M6281 Muscle weakness (generalized): Secondary | ICD-10-CM | POA: Diagnosis not present

## 2023-03-01 DIAGNOSIS — R0602 Shortness of breath: Secondary | ICD-10-CM | POA: Diagnosis not present

## 2023-03-01 DIAGNOSIS — Z751 Person awaiting admission to adequate facility elsewhere: Secondary | ICD-10-CM

## 2023-03-01 DIAGNOSIS — R531 Weakness: Secondary | ICD-10-CM | POA: Diagnosis not present

## 2023-03-01 DIAGNOSIS — R41841 Cognitive communication deficit: Secondary | ICD-10-CM | POA: Diagnosis not present

## 2023-03-01 DIAGNOSIS — I5031 Acute diastolic (congestive) heart failure: Secondary | ICD-10-CM | POA: Diagnosis not present

## 2023-03-01 DIAGNOSIS — R1312 Dysphagia, oropharyngeal phase: Secondary | ICD-10-CM | POA: Diagnosis not present

## 2023-03-01 DIAGNOSIS — Z741 Need for assistance with personal care: Secondary | ICD-10-CM | POA: Diagnosis not present

## 2023-03-01 LAB — TECHNOLOGIST SMEAR REVIEW: RBC MORPHOLOGY: DECREASED

## 2023-03-01 LAB — CBC WITH DIFFERENTIAL/PLATELET
Abs Immature Granulocytes: 0.01 10*3/uL (ref 0.00–0.07)
Basophils Absolute: 0 10*3/uL (ref 0.0–0.1)
Basophils Relative: 1 %
Eosinophils Absolute: 0.1 10*3/uL (ref 0.0–0.5)
Eosinophils Relative: 2 %
HCT: 35 % — ABNORMAL LOW (ref 36.0–46.0)
Hemoglobin: 10.9 g/dL — ABNORMAL LOW (ref 12.0–15.0)
Immature Granulocytes: 0 %
Lymphocytes Relative: 38 %
Lymphs Abs: 1.4 10*3/uL (ref 0.7–4.0)
MCH: 27.7 pg (ref 26.0–34.0)
MCHC: 31.1 g/dL (ref 30.0–36.0)
MCV: 88.8 fL (ref 80.0–100.0)
Monocytes Absolute: 0.5 10*3/uL (ref 0.1–1.0)
Monocytes Relative: 13 %
Neutro Abs: 1.7 10*3/uL (ref 1.7–7.7)
Neutrophils Relative %: 46 %
Platelets: 103 10*3/uL — ABNORMAL LOW (ref 150–400)
RBC: 3.94 MIL/uL (ref 3.87–5.11)
RDW: 15.2 % (ref 11.5–15.5)
WBC: 3.6 10*3/uL — ABNORMAL LOW (ref 4.0–10.5)
nRBC: 0 % (ref 0.0–0.2)

## 2023-03-01 LAB — COMPREHENSIVE METABOLIC PANEL
ALT: 19 U/L (ref 0–44)
AST: 24 U/L (ref 15–41)
Albumin: 3.8 g/dL (ref 3.5–5.0)
Alkaline Phosphatase: 61 U/L (ref 38–126)
Anion gap: 11 (ref 5–15)
BUN: 17 mg/dL (ref 8–23)
CO2: 29 mmol/L (ref 22–32)
Calcium: 9 mg/dL (ref 8.9–10.3)
Chloride: 100 mmol/L (ref 98–111)
Creatinine, Ser: 0.82 mg/dL (ref 0.44–1.00)
GFR, Estimated: >60 mL/min (ref >60)
Glucose, Bld: 99 mg/dL (ref 70–99)
Potassium: 3.9 mmol/L (ref 3.5–5.1)
Sodium: 140 mmol/L (ref 135–145)
Total Bilirubin: 0.9 mg/dL (ref 0.3–1.2)
Total Protein: 6.4 g/dL — ABNORMAL LOW (ref 6.5–8.1)

## 2023-03-01 LAB — URINALYSIS, ROUTINE W REFLEX MICROSCOPIC
Bilirubin Urine: NEGATIVE
Glucose, UA: NEGATIVE mg/dL
Hgb urine dipstick: NEGATIVE
Ketones, ur: NEGATIVE mg/dL
Leukocytes,Ua: NEGATIVE
Nitrite: NEGATIVE
Protein, ur: NEGATIVE mg/dL
Specific Gravity, Urine: 1.009 (ref 1.005–1.030)
pH: 7 (ref 5.0–8.0)

## 2023-03-01 LAB — TROPONIN I (HIGH SENSITIVITY): Troponin I (High Sensitivity): 8 ng/L (ref <18)

## 2023-03-01 LAB — BRAIN NATRIURETIC PEPTIDE: B Natriuretic Peptide: 169.9 pg/mL — ABNORMAL HIGH (ref 0.0–100.0)

## 2023-03-01 MED ORDER — ACETAMINOPHEN 325 MG PO TABS
650.0000 mg | ORAL_TABLET | Freq: Four times a day (QID) | ORAL | Status: DC | PRN
  Administered 2023-03-02 – 2023-03-07 (×5): 650 mg via ORAL
  Filled 2023-03-01 (×5): qty 2

## 2023-03-01 MED ORDER — FUROSEMIDE 10 MG/ML IJ SOLN
40.0000 mg | Freq: Two times a day (BID) | INTRAMUSCULAR | Status: DC
  Administered 2023-03-02 – 2023-03-03 (×3): 40 mg via INTRAVENOUS
  Filled 2023-03-01 (×3): qty 4

## 2023-03-01 MED ORDER — ONDANSETRON HCL 4 MG/2ML IJ SOLN
4.0000 mg | Freq: Four times a day (QID) | INTRAMUSCULAR | Status: DC | PRN
  Administered 2023-03-06 – 2023-03-07 (×2): 4 mg via INTRAVENOUS
  Filled 2023-03-01 (×2): qty 2

## 2023-03-01 MED ORDER — FUROSEMIDE 10 MG/ML IJ SOLN
40.0000 mg | Freq: Once | INTRAMUSCULAR | Status: AC
  Administered 2023-03-01: 40 mg via INTRAVENOUS
  Filled 2023-03-01: qty 4

## 2023-03-01 MED ORDER — DIAZEPAM 5 MG PO TABS
2.5000 mg | ORAL_TABLET | Freq: Two times a day (BID) | ORAL | Status: DC | PRN
  Administered 2023-03-03 – 2023-03-07 (×3): 5 mg via ORAL
  Filled 2023-03-01 (×3): qty 1

## 2023-03-01 MED ORDER — ONDANSETRON HCL 4 MG PO TABS: 4.0000 mg | ORAL_TABLET | Freq: Four times a day (QID) | ORAL | Status: DC | PRN

## 2023-03-01 MED ORDER — HYDRALAZINE HCL 25 MG PO TABS: 25.0000 mg | ORAL_TABLET | Freq: Four times a day (QID) | ORAL | Status: DC | PRN

## 2023-03-01 MED ORDER — SODIUM CHLORIDE 0.9% FLUSH
3.0000 mL | Freq: Two times a day (BID) | INTRAVENOUS | Status: DC
  Administered 2023-03-01 – 2023-03-07 (×11): 3 mL via INTRAVENOUS

## 2023-03-01 MED ORDER — PRAVASTATIN SODIUM 40 MG PO TABS
40.0000 mg | ORAL_TABLET | Freq: Every day | ORAL | Status: DC
  Administered 2023-03-02 – 2023-03-07 (×6): 40 mg via ORAL
  Filled 2023-03-01 (×6): qty 1

## 2023-03-01 MED ORDER — LABETALOL HCL 5 MG/ML IV SOLN
10.0000 mg | Freq: Once | INTRAVENOUS | Status: AC
  Administered 2023-03-01: 10 mg via INTRAVENOUS
  Filled 2023-03-01: qty 4

## 2023-03-01 MED ORDER — SENNOSIDES-DOCUSATE SODIUM 8.6-50 MG PO TABS
1.0000 | ORAL_TABLET | Freq: Every evening | ORAL | Status: DC | PRN
  Administered 2023-03-03 – 2023-03-07 (×2): 1 via ORAL
  Filled 2023-03-01 (×2): qty 1

## 2023-03-01 MED ORDER — ACETAMINOPHEN 650 MG RE SUPP: 650.0000 mg | Freq: Four times a day (QID) | RECTAL | Status: DC | PRN

## 2023-03-01 MED ORDER — CARVEDILOL 25 MG PO TABS
25.0000 mg | ORAL_TABLET | Freq: Two times a day (BID) | ORAL | Status: DC
  Administered 2023-03-01 – 2023-03-07 (×12): 25 mg via ORAL
  Filled 2023-03-01: qty 2
  Filled 2023-03-01 (×4): qty 1
  Filled 2023-03-01: qty 2
  Filled 2023-03-01 (×6): qty 1

## 2023-03-01 MED ORDER — ASPIRIN 81 MG PO TBEC
81.0000 mg | DELAYED_RELEASE_TABLET | Freq: Every day | ORAL | Status: DC
  Administered 2023-03-02 – 2023-03-07 (×6): 81 mg via ORAL
  Filled 2023-03-01 (×6): qty 1

## 2023-03-01 NOTE — ED Triage Notes (Signed)
[facility] from PCP. Pt noncompliant with diuretic. Pt went to PCP for physical and had BL crackles and BLE pitting edema. Denis SOB chest pain. A&ox4. BP 152/100 P 81 afib. SPO2 94 RA

## 2023-03-01 NOTE — H&P (Signed)
History and Physical    Rebecca Bradshaw ZOX:[id number] DOB: 04/10/1931 DOA: 03/01/2023  PCP: Noberto Retort, MD   Patient coming from: Home   Chief Complaint: Leg swelling, SOB   HPI: Rebecca Bradshaw is a pleasant 89 y.o. female with medical history significant for hypertension, hyperlipidemia, and atrial fibrillation who is no longer anticoagulated and now presents with shortness of breath and bilateral lower extremity swelling.  Patient reports insidiously worsening dyspnea, particularly at night or with exertion, as well as worsening bilateral lower extremity edema.  She is very hesitant to take diuretics at home due to difficulty making it to the bathroom.  ED Course: Upon arrival to the ED, patient is found to be afebrile and saturating well on room air with elevated blood pressure.  Labs are most notable for BNP 170, WBC 3600, and platelets 103,000.  Patient was treated with 40 mg IV Lasix and 10 mg IV labetalol in the ED.  Review of Systems:  All other systems reviewed and apart from HPI, are negative.  Past Medical History:  Diagnosis Date   A-fib [facility])    Arrhythmia 2014   chronic/permanent atrial fibrillation -rate control with carvedilol and anticoag wth warfarin   Chronic diarrhea    Essential hypertension 06/30/2013   History of diverticulitis    Hyperlipidemia    on statin therapy    Past Surgical History:  Procedure Laterality Date   abdominal  aorta doppler  10/17/2007   normal taper--normal duplex   DOPPLER ECHOCARDIOGRAPHY  10/17/2007   EF=>55%; -LA -moderated diltaed; RA-moderately; borderline mitral valve prolapse of AMVL- moderate mitral regurg.; moderatetricuspid regurg.--rgt ventricular systolic pressure elevated 30-40 mmHg   NM MYOVIEW LTD  06/21/2011   EF 78%  LV systolic function normal - NORMAL STUDY   TRANSESOPHAGEAL ECHOCARDIOGRAM  12/10/2007   normal left ventricle ; normal aortic valve there was trivial  valvular regurg; left atium markedly  dilated no evidence for atrial thrombus,; mild to moderate tricuspid valvular regurg. , right atrium markedly dilated    Social History:   reports that she has never smoked. She has never used smokeless tobacco. She reports that she does not drink alcohol and does not use drugs.  Allergies  Allergen Reactions   Codeine Nausea And Vomiting   Metoprolol Other (See Comments)    "Nightmares"   Morphine And Codeine Nausea And Vomiting   Pantoprazole Other (See Comments)    nightmares    Family History  Problem Relation Age of Onset   Heart disease Mother    Heart attack Father    Diabetes Sister    Cancer Brother      Prior to Admission medications   Medication Sig Start Date End Date Taking? Authorizing Provider  acetaminophen (TYLENOL) 325 MG tablet Take 650 mg by mouth daily as needed for mild pain.    [provider]  aspirin 81 MG tablet Take 1 tablet (81 mg total) by mouth daily. 07/05/15   Croitoru, Mihai, MD  calcium-vitamin D (OSCAL WITH D) 500-200 MG-UNIT per tablet Take 1 tablet by mouth daily.    [provider]  carvedilol (COREG) 25 MG tablet TAKE 1 TABLET BY MOUTH TWICE DAILY WITH A MEAL 03/02/21   Croitoru, Mihai, MD  Cholecalciferol (VITAMIN D PO) Take 1 Units by mouth daily.     [provider]  diazepam (VALIUM) 5 MG tablet Take 2.5 mg by mouth 2 (two) times daily. Take 0.5 tablet (2.5 mg total) by mouth every morning  and take 1 tablet (5 mg total) by mouth every evening. 11/19/14   [provider]  meclizine (ANTIVERT) 25 MG tablet Take 25 mg by mouth 3 (three) times daily as needed for dizziness.    [provider]  Multiple Vitamins-Minerals (PRESERVISION AREDS PO) Take by mouth.    [provider]  ofloxacin (FLOXIN) 0.3 % OTIC solution Place 5 drops in ear(s) 2 (two) times daily. 09/28/21   [provider]  ondansetron (ZOFRAN-ODT) 4 MG disintegrating tablet Take 1 tablet by mouth every 8 (eight) hours as  needed.    [provider]  Polyvinyl Alcohol-Povidone (REFRESH OP) Place 2 drops into both eyes 2 (two) times daily as needed (DRY EYES).    [provider]  pravastatin (PRAVACHOL) 40 MG tablet Take 1 tablet (40 mg total) by mouth daily. 04/24/19   Croitoru, Mihai, MD  senna-docusate (SENOKOT-S) 8.6-50 MG tablet Take 1 tablet by mouth at bedtime.    [provider]  Solifenacin Succinate (VESICARE PO) Take by mouth at bedtime.    [provider]  traMADol (ULTRAM) 50 MG tablet Take 1 tablet (50 mg total) by mouth every 6 (six) hours as needed. 06/22/17   Vanetta Mulders, MD    Physical Exam: Vitals:   03/01/23 1730 03/01/23 1800 03/01/23 1830 03/01/23 1900  BP: (!) 198/104 (!) 207/106 (!) 210/89 (!) 191/106  Pulse: 84 71 73 77  Resp: 19 (!) 21 (!) 23 17  Temp:      TempSrc:      SpO2: 97% 97% 97% 95%  Weight:      Height:         Constitutional: NAD, calm  Eyes: PERTLA, lids and conjunctivae normal ENMT: Mucous membranes are moist. Posterior pharynx clear of any exudate or lesions.   Neck: supple, no masses  Respiratory: no wheezing, no crackles. No accessory muscle use.  Cardiovascular: S1 & S2 heard, regular rate and rhythm. Pretibial pitting edema bilaterally.   Abdomen: Soft, tender in lower abdomen, no guarding. Bowel sounds active.  Musculoskeletal: no clubbing / cyanosis. No joint deformity upper and lower extremities.   Skin: no significant rashes, lesions, ulcers. Warm, dry, well-perfused. Neurologic: CN 2-12 grossly intact. Moving all extremities. Resting tremor. Alert and oriented.  Psychiatric: Pleasant. Cooperative.    Labs and Imaging on Admission: I have personally reviewed following labs and imaging studies  CBC: Recent Labs  Lab 03/01/23 1721  WBC 3.6*  NEUTROABS 1.7  HGB 10.9*  HCT 35.0*  MCV 88.8  PLT 103*   Basic Metabolic Panel: Recent Labs  Lab 03/01/23 1721  NA 140  K 3.9  CL 100  CO2 29  GLUCOSE 99   BUN 17  CREATININE 0.82  CALCIUM 9.0   GFR: Estimated Creatinine Clearance: 37.7 mL/min (by C-G formula based on SCr of 0.82 mg/dL). Liver Function Tests: Recent Labs  Lab 03/01/23 1721  AST 24  ALT 19  ALKPHOS 61  BILITOT 0.9  PROT 6.4*  ALBUMIN 3.8   No results for input(s): "LIPASE", "AMYLASE" in the last 168 hours. No results for input(s): "AMMONIA" in the last 168 hours. Coagulation Profile: No results for input(s): "INR", "PROTIME" in the last 168 hours. Cardiac Enzymes: No results for input(s): "CKTOTAL", "CKMB", "CKMBINDEX", "TROPONINI" in the last 168 hours. BNP (last 3 results) No results for input(s): "PROBNP" in the last 8760 hours. HbA1C: No results for input(s): "HGBA1C" in the last 72 hours. CBG: No results for input(s): "GLUCAP" in the last  168 hours. Lipid Profile: No results for input(s): "CHOL", "HDL", "LDLCALC", "TRIG", "CHOLHDL", "LDLDIRECT" in the last 72 hours. Thyroid Function Tests: No results for input(s): "TSH", "T4TOTAL", "FREET4", "T3FREE", "THYROIDAB" in the last 72 hours. Anemia Panel: No results for input(s): "VITAMINB12", "FOLATE", "FERRITIN", "TIBC", "IRON", "RETICCTPCT" in the last 72 hours. Urine analysis:    Component Value Date/Time   COLORURINE YELLOW 03/01/2023 1717   APPEARANCEUR CLEAR 03/01/2023 1717   LABSPEC 1.009 03/01/2023 1717   PHURINE 7.0 03/01/2023 1717   GLUCOSEU NEGATIVE 03/01/2023 1717   HGBUR NEGATIVE 03/01/2023 1717   BILIRUBINUR NEGATIVE 03/01/2023 1717   KETONESUR NEGATIVE 03/01/2023 1717   PROTEINUR NEGATIVE 03/01/2023 1717   UROBILINOGEN 0.2 04/07/2014 1104   NITRITE NEGATIVE 03/01/2023 1717   LEUKOCYTESUR NEGATIVE 03/01/2023 1717   Sepsis Labs: @LABRCNTIP (procalcitonin:4,lacticidven:4) )No results found for this or any previous visit (from the past 240 hour(s)).   Radiological Exams on Admission: DG Chest Port 1 View  Result Date: 03/01/2023 CLINICAL DATA:  Shortness of breath EXAM: PORTABLE CHEST  1 VIEW COMPARISON:  Abdominal series with chest x-ray 12/31/2015 FINDINGS: The heart is enlarged. The left costophrenic angle has been excluded from the exam. The lungs otherwise appear clear. There is no pneumothorax or acute fracture. IMPRESSION: Cardiomegaly. No acute cardiopulmonary process. Electronically Signed   By: Darliss Cheney M.D.   On: 03/01/2023 19:17    EKG: Independently reviewed. Undetermined rhythm, RAD, QTc 448.   Assessment/Plan   1. Acute on chronic HFpEF  - Continue diuresis with IV Lasix, continue Coreg, monitor weight and I/Os, update echocardiogram    2. Hypertensive urgency  - No target-organ damage  - Anticipate improvement with diuresis  - Continue diuresis, continue Coreg   3. Pancytopenia  - Mild, no evidence for infection  - Check reticulocytes, PT, and smear review, repeat CBC in am   4. Atrial fibrillation  - Not anticoagulated d/t falls  - Continue Coreg   5. Anxiety  - Continue Valium  6. Abdominal pain  - Lower abdominal pain with nausea but no vomiting  - UA unremarkable, LFTs normal, no infectious s/s - Check KUB, continue supportive care    DVT prophylaxis: SCDs  Code Status: DNR/DNI, discussed with patient and her son and daughter  Level of Care: Level of care: Telemetry Cardiac Family Communication: Son and daughter at bedside   Disposition Plan:  Patient is from: Home   Anticipated d/c is to: TBD Anticipated d/c date is: 03/03/23  Patient currently: Pending improved volume status, stable blood counts  Consults called: None  Admission status: Inpatient     Briscoe Deutscher, MD [facility]  03/01/2023, 8:01 PM

## 2023-03-01 NOTE — ED Provider Notes (Signed)
[place] EMERGENCY DEPARTMENT AT [facility] Provider Note   CSN: [id number] Arrival date & time: 03/01/23  1534     History  Chief Complaint  Patient presents with   Shortness of Breath   Congestive Heart Failure    Rebecca AUST is a 89 y.o. female.  HPI  89 year old female with permanent atrial fibrillation,CHADSVasc 4,  HL, and HTN she remains quite unsteady and prone to falls, with chronic back pain since vertebral body fracture from a fall.  and is no longer on anticoagulation.  Patient has becoming increasingly short of breath for several months but worse over the last few days.  Patient was seen at the cardiologist office by NP Metta Clines.  Patient has not been interested in taking diuretics due to frequency of urination especially during the night.  Patient reports that even without the diuretic she has to get up and urinate multiple times during the night.  Ever, she does report she is gotten increasingly swollen in her lower legs and increasingly short of breath with activity.  Patient reports at night sometimes she feels like her heart is racing but denies any active or ongoing chest pain.  She has not had any fevers or chills per se.  She reports she is "always cold".  She has had a more or less persistent nagging cough for several months.  Patient was sent to the emergency department with the plan for admission for diuresis from cardiology visit today.    Home Medications Prior to Admission medications   Medication Sig Start Date End Date Taking? Authorizing Provider  acetaminophen (TYLENOL) 325 MG tablet Take 650 mg by mouth daily as needed for mild pain.    [provider]  aspirin 81 MG tablet Take 1 tablet (81 mg total) by mouth daily. 07/05/15   Croitoru, Mihai, MD  calcium-vitamin D (OSCAL WITH D) 500-200 MG-UNIT per tablet Take 1 tablet by mouth daily.    [provider]  carvedilol (COREG) 25 MG tablet TAKE 1 TABLET BY MOUTH  TWICE DAILY WITH A MEAL 03/02/21   Croitoru, Mihai, MD  Cholecalciferol (VITAMIN D PO) Take 1 Units by mouth daily.     [provider]  diazepam (VALIUM) 5 MG tablet Take 2.5 mg by mouth 2 (two) times daily. Take 0.5 tablet (2.5 mg total) by mouth every morning and take 1 tablet (5 mg total) by mouth every evening. 11/19/14   [provider]  meclizine (ANTIVERT) 25 MG tablet Take 25 mg by mouth 3 (three) times daily as needed for dizziness.    [provider]  Multiple Vitamins-Minerals (PRESERVISION AREDS PO) Take by mouth.    [provider]  ofloxacin (FLOXIN) 0.3 % OTIC solution Place 5 drops in ear(s) 2 (two) times daily. 09/28/21   [provider]  ondansetron (ZOFRAN-ODT) 4 MG disintegrating tablet Take 1 tablet by mouth every 8 (eight) hours as needed.    [provider]  Polyvinyl Alcohol-Povidone (REFRESH OP) Place 2 drops into both eyes 2 (two) times daily as needed (DRY EYES).    [provider]  pravastatin (PRAVACHOL) 40 MG tablet Take 1 tablet (40 mg total) by mouth daily. 04/24/19   Croitoru, Mihai, MD  senna-docusate (SENOKOT-S) 8.6-50 MG tablet Take 1 tablet by mouth at bedtime.    [provider]  Solifenacin Succinate (VESICARE PO) Take by mouth at bedtime.    [provider]  traMADol (ULTRAM) 50 MG tablet Take 1  tablet (50 mg total) by mouth every 6 (six) hours as needed. 06/22/17   Vanetta Mulders, MD      Allergies    Codeine, Metoprolol, Morphine and codeine, and Pantoprazole    Review of Systems   Review of Systems  Physical Exam Updated Vital Signs BP (!) 198/104   Pulse 84   Temp 97.7 F (36.5 C) (Axillary)   Resp 19   Ht 5\' 3"  (1.6 m)   Wt 58.2 kg   SpO2 97%   BMI 22.73 kg/m  Physical Exam Constitutional:      Comments: Alert nontoxic.  Clear mental status.  Excellent condition for age.  No respiratory distress at rest.  HENT:     Mouth/Throat:     Pharynx: Oropharynx is  clear.  Eyes:     Extraocular Movements: Extraocular movements intact.  Neck:     Comments: Positive JVD Cardiovascular:     Rate and Rhythm: Normal rate and regular rhythm.  Pulmonary:     Comments: Crackles lower one third lung fields. Abdominal:     General: There is no distension.     Palpations: Abdomen is soft.     Comments: Mild diffuse lower abdominal tenderness.  No guarding.  Musculoskeletal:     Comments: 2+ pitting edema bilateral lower extremities.  Chronic changes of venous stasis with hyperpigmentation but no appearance of acute cellulitis.  Dorsalis pedis pulses are 2+ and symmetric.  Skin:    General: Skin is warm and dry.  Neurological:     General: No focal deficit present.     Mental Status: She is oriented to person, place, and time.  Psychiatric:        Mood and Affect: Mood normal.     ED Results / Procedures / Treatments   Labs (all labs ordered are listed, but only abnormal results are displayed) Labs Reviewed  BRAIN NATRIURETIC PEPTIDE  COMPREHENSIVE METABOLIC PANEL  CBC WITH DIFFERENTIAL/PLATELET  URINALYSIS, ROUTINE W REFLEX MICROSCOPIC  TROPONIN I (HIGH SENSITIVITY)    EKG EKG Interpretation Date/Time:  Thursday March 01 2023 16:08:54 EDT Ventricular Rate:  76 PR Interval:  210 QRS Duration:  84 QT Interval:  398 QTC Calculation: 448 R Axis:   232  Text Interpretation: Atrial fibrillation Right axis deviation Probable anteroseptal infarct, old Confirmed by Gilda Crease [phone]) on 03/02/2023 6:05:32 PM  Radiology No results found.  Procedures Procedures   CRITICAL CARE Performed by: Arby Barrette   Total critical care time: 30 minutes  Critical care time was exclusive of separately billable procedures and treating other patients.  Critical care was necessary to treat or prevent imminent or life-threatening deterioration.  Critical care was time spent personally by me on the following activities: development of  treatment plan with patient and/or surrogate as well as nursing, discussions with consultants, evaluation of patient's response to treatment, examination of patient, obtaining history from patient or surrogate, ordering and performing treatments and interventions, ordering and review of laboratory studies, ordering and review of radiographic studies, pulse oximetry and re-evaluation of patient's condition.  Medications Ordered in ED Medications - No data to display  ED Course/ Medical Decision Making/ A&P                                 Medical Decision Making Amount and/or Complexity of Data Reviewed Labs: ordered. Radiology: ordered.  Risk Prescription drug management. Decision regarding hospitalization.   Patient was  seen at cardiology office today and referred to the emergency department for further evaluation and recommendation for admission for diuresis.  Patient does have signs of volume overload on exam with crackles and peripheral edema.  She is hypertensive on arrival.  She reports compliance with her antihypertensive medications.  He does not have any active chest pain.  Will proceed with diagnostic evaluation and initiate diuresis with Lasix patient takes Coreg for blood pressure and heart rate control.  Will give a dose of labetalol.  Current pressures are ranging from high 190s to low 200s systolic over greater than 100 diastolic.  Consult: Cardiology advises appropriate for medical admission.  Consult: Dr. Antionette Char for admission.        Final Clinical Impression(s) / ED Diagnoses Final diagnoses:  Acute on chronic congestive heart failure, unspecified heart failure type [facility])  Hypertensive urgency    Rx / DC Orders ED Discharge Orders     None         Arby Barrette, MD 03/06/23 1415

## 2023-03-02 ENCOUNTER — Inpatient Hospital Stay (HOSPITAL_COMMUNITY): Payer: Medicare Other

## 2023-03-02 DIAGNOSIS — I5031 Acute diastolic (congestive) heart failure: Secondary | ICD-10-CM | POA: Diagnosis not present

## 2023-03-02 DIAGNOSIS — F419 Anxiety disorder, unspecified: Secondary | ICD-10-CM

## 2023-03-02 DIAGNOSIS — I5033 Acute on chronic diastolic (congestive) heart failure: Secondary | ICD-10-CM | POA: Diagnosis not present

## 2023-03-02 DIAGNOSIS — I160 Hypertensive urgency: Secondary | ICD-10-CM | POA: Diagnosis not present

## 2023-03-02 DIAGNOSIS — D61818 Other pancytopenia: Secondary | ICD-10-CM | POA: Diagnosis not present

## 2023-03-02 DIAGNOSIS — I4821 Permanent atrial fibrillation: Secondary | ICD-10-CM | POA: Diagnosis not present

## 2023-03-02 LAB — BASIC METABOLIC PANEL
Anion gap: 9 (ref 5–15)
BUN: 14 mg/dL (ref 8–23)
CO2: 31 mmol/L (ref 22–32)
Calcium: 8.6 mg/dL — ABNORMAL LOW (ref 8.9–10.3)
Chloride: 97 mmol/L — ABNORMAL LOW (ref 98–111)
Creatinine, Ser: 0.74 mg/dL (ref 0.44–1.00)
GFR, Estimated: >60 mL/min (ref >60)
Glucose, Bld: 92 mg/dL (ref 70–99)
Potassium: 3.2 mmol/L — ABNORMAL LOW (ref 3.5–5.1)
Sodium: 137 mmol/L (ref 135–145)

## 2023-03-02 LAB — ECHOCARDIOGRAM COMPLETE
Height: 63 in
Weight: 1851.86 oz

## 2023-03-02 LAB — CBC
HCT: 33.8 % — ABNORMAL LOW (ref 36.0–46.0)
Hemoglobin: 10.7 g/dL — ABNORMAL LOW (ref 12.0–15.0)
MCH: 27.7 pg (ref 26.0–34.0)
MCHC: 31.7 g/dL (ref 30.0–36.0)
MCV: 87.6 fL (ref 80.0–100.0)
Platelets: 105 10*3/uL — ABNORMAL LOW (ref 150–400)
RBC: 3.86 MIL/uL — ABNORMAL LOW (ref 3.87–5.11)
RDW: 15.2 % (ref 11.5–15.5)
WBC: 3.6 10*3/uL — ABNORMAL LOW (ref 4.0–10.5)
nRBC: 0 % (ref 0.0–0.2)

## 2023-03-02 LAB — MAGNESIUM: Magnesium: 1.9 mg/dL (ref 1.7–2.4)

## 2023-03-02 LAB — RETICULOCYTES
Immature Retic Fract: 5.1 % (ref 2.3–15.9)
RBC.: 3.84 MIL/uL — ABNORMAL LOW (ref 3.87–5.11)
Retic Count, Absolute: 34.2 10*3/uL (ref 19.0–186.0)
Retic Ct Pct: 0.9 % (ref 0.4–3.1)

## 2023-03-02 LAB — PROTIME-INR
INR: 1.2 (ref 0.8–1.2)
Prothrombin Time: 15.5 seconds — ABNORMAL HIGH (ref 11.4–15.2)

## 2023-03-02 MED ORDER — SPIRONOLACTONE 12.5 MG HALF TABLET
12.5000 mg | ORAL_TABLET | Freq: Every day | ORAL | Status: DC
  Administered 2023-03-02 – 2023-03-07 (×6): 12.5 mg via ORAL
  Filled 2023-03-02 (×6): qty 1

## 2023-03-02 MED ORDER — INFLUENZA VAC A&B SURF ANT ADJ 0.5 ML IM SUSY
0.5000 mL | PREFILLED_SYRINGE | INTRAMUSCULAR | Status: AC
  Administered 2023-03-05: 0.5 mL via INTRAMUSCULAR
  Filled 2023-03-02: qty 0.5

## 2023-03-02 MED ORDER — POTASSIUM CHLORIDE CRYS ER 20 MEQ PO TBCR
40.0000 meq | EXTENDED_RELEASE_TABLET | ORAL | Status: AC
  Administered 2023-03-02 (×2): 40 meq via ORAL
  Filled 2023-03-02 (×2): qty 2

## 2023-03-02 MED ORDER — EMPAGLIFLOZIN 10 MG PO TABS
10.0000 mg | ORAL_TABLET | Freq: Every day | ORAL | Status: DC
  Administered 2023-03-02 – 2023-03-07 (×6): 10 mg via ORAL
  Filled 2023-03-02 (×6): qty 1

## 2023-03-02 NOTE — Progress Notes (Signed)
  Echocardiogram 2D Echocardiogram has been performed.  Rebecca Bradshaw 03/02/2023, 1:51 PM

## 2023-03-02 NOTE — Plan of Care (Signed)
Problem: Education: Goal: Ability to demonstrate management of disease process will improve Outcome: Progressing   Problem: Education: Goal: Ability to verbalize understanding of medication therapies will improve Outcome: Progressing   Problem: Activity: Goal: Capacity to carry out activities will improve Outcome: Progressing

## 2023-03-02 NOTE — TOC Initial Note (Signed)
Transition of Care [facility]) - Initial/Assessment Note    Patient Details  Name: Rebecca Bradshaw MRN: [id number] Date of Birth: Apr 13, 1931  Transition of Care [facility]) CM/SW Contact:    Leone Haven, RN Phone Number: 03/02/2023, 3:48 PM  Clinical Narrative:                 From home alone,  has PCP and insurance on file, states has no HH services in place at this time, has walker, cane, bsc.  States DIL will transport her home at dc and family is support system, states gets medications from [facility].  Pta ambulatory with cane and walker.   Expected Discharge Plan: Home w Home Health Services Barriers to Discharge: Continued Medical Work up   Patient Goals and CMS Choice Patient states their goals for this hospitalization and ongoing recovery are:: return home   Choice offered to / list presented to : NA      Expected Discharge Plan and Services In-house Referral: NA Discharge Planning Services: CM Consult Post Acute Care Choice: NA Living arrangements for the past 2 months: Single Family Home                 DME Arranged: N/A DME Agency: NA       HH Arranged: NA          Prior Living Arrangements/Services Living arrangements for the past 2 months: Single Family Home Lives with:: Self Patient language and need for interpreter reviewed:: Yes Do you feel safe going back to the place where you live?: Yes      Need for Family Participation in Patient Care: Yes (Comment) Care giver support system in place?: Yes (comment) Current home services: DME (walker, cane, bsc) Criminal Activity/Legal Involvement Pertinent to Current Situation/Hospitalization: No - Comment as needed  Activities of Daily Living Home Assistive Devices/Equipment: Walker (specify type) ADL Screening (condition at time of admission) Patient's cognitive ability adequate to safely complete daily activities?: Yes Is the patient deaf or have difficulty hearing?: No Does the patient have  difficulty seeing, even when wearing glasses/contacts?: No Does the patient have difficulty concentrating, remembering, or making decisions?: No Patient able to express need for assistance with ADLs?: Yes Does the patient have difficulty dressing or bathing?: No Independently performs ADLs?: Yes (appropriate for developmental age) Does the patient have difficulty walking or climbing stairs?: Yes Weakness of Legs: Both Weakness of Arms/Hands: Both  Permission Sought/Granted Permission sought to share information with : Case Manager Permission granted to share information with : Yes, Verbal Permission Granted              Emotional Assessment   Attitude/Demeanor/Rapport: Engaged Affect (typically observed): Appropriate Orientation: : Oriented to Self, Oriented to Place, Oriented to  Time, Oriented to Situation Alcohol / Substance Use: Not Applicable Psych Involvement: No (comment)  Admission diagnosis:  Hypertensive urgency [I16.0] Acute on chronic diastolic CHF (congestive heart failure) (HCC) [I50.33] Acute on chronic congestive heart failure, unspecified heart failure type [facility]) [I50.9] Patient Active Problem List   Diagnosis Date Noted   Acute on chronic diastolic CHF (congestive heart failure) (HCC) 03/01/2023   Hypertensive urgency 03/01/2023   Pancytopenia (HCC) 03/01/2023   Burst fracture of lumbar vertebra (HCC) 06/28/2017   Fall 09/19/2014   Essential hypertension 06/30/2013   Hypercholesterolemia 06/30/2013   Frequency of micturition 06/30/2013   Atrial fibrillation (HCC) 08/28/2012   Long-term (current) use of anticoagulants 08/28/2012   PCP:  Noberto Retort, MD Pharmacy:  [facility] - [place], [place] - [address] [place] [address] Phone: [phone] Fax: [phone]     Social Determinants of Health (SDOH) Social History: SDOH Screenings   Food Insecurity: No Food Insecurity (03/02/2023)  Housing: Low Risk   (03/02/2023)  Transportation Needs: No Transportation Needs (03/02/2023)  Utilities: Not At Risk (03/02/2023)  Social Connections: Unknown (01/26/2022)   Received from [facility], [facility]  Tobacco Use: Low Risk  (03/01/2023)   SDOH Interventions:     Readmission Risk Interventions     No data to display

## 2023-03-02 NOTE — Assessment & Plan Note (Signed)
Continue with losartan, carvedilol and spironolactone.

## 2023-03-02 NOTE — Progress Notes (Signed)
Progress Note   Patient: Rebecca Bradshaw WUJ:[id number] DOB: 1930/10/29 DOA: 03/01/2023     1 DOS: the patient was seen and examined on 03/02/2023   Brief hospital course: Mrs. Hosbach was admitted to the hospital with the working diagnosis of heart failure exacerbation.   89 yo female with the past medical history of hypertension, hyperlipidemia, and atrial fibrillation who presented with dyspnea and lower extremity edema. She endorsed progressive dyspnea on exertion. She has been hesitant to take diuretics due to polyuria. On 09/19 she was evaluated by cardiology and was found volume overloaded, considering her frailty and ambulatory dysfunction she was referred to the hospital for further diuresis. On her initial physical examination her blood pressure was 198/104, HR 84, RR 23 and 02 saturation 94% on room air, heart with S1 and S2 present, regular rhythm with no gallops or murmurs, lungs with no rhonchi or wheezing, abdomen with no distention and positive lower extremity edema.   Na 140, K 3,9 Cl 100, bicarbonate 29, glucose 99, bun 17 cr 0,82  BNP 169.9 Wbc 3,6 hgb 10,9 plt 103  Urine analysis with SG 1,009, negative for proteins and negative for leukocytes.   Chest radiograph with left rotation, cardiomegaly, bilateral hilar vascular congestion, cephalization of the vasculature and fluid in the right fissure.       Assessment and Plan: * Acute on chronic diastolic CHF (congestive heart failure) (HCC) Echocardiogram with preserved LV systolic function with EF 55 to 60%, mild concentric LVH, RV with low normal systolic function, RVSP 40,3 mmHg, LA and RA with severe dilatation, no significant valvular disease.   Urine output is 2,800 ml Systolic blood pressure 150 to 160 mmHg.   Plan to continue diuresis with furosemide 40 mg IV bid. Continue with carvedilol 25 mg  Add spironolactone and SGLT 2 inh.  If good toleration will add ARB.   Atrial fibrillation (HCC) Continue rate  control with carvedilol.  She has been off anticoagulation due to fall risk and frailty.  Continue telemetry monitoring.    Hypertensive urgency Blood pressure has improved with diuresis.  Will add SGLT 2 inh and spironolactone, if good toleration will add ARB.   Pancytopenia (HCC) Cell count has been stable.   Anxiety Continue with diazepam per her home regimen.         Subjective: Patient with improvement in dyspnea, she had a very brief episode of sharp chest pain, self limited. Positive cramps at her lower extremities.   Physical Exam: Vitals:   03/02/23 0730 03/02/23 0800 03/02/23 0822 03/02/23 0913  BP: (!) 177/78 (!) 151/96  (!) 193/96  Pulse: 72 81  70  Resp: 19 18  18   Temp:   (!) 97.5 F (36.4 C) 98.2 F (36.8 C)  TempSrc:   Axillary Oral  SpO2: 91% 94%  96%  Weight:      Height:    5\' 3"  (1.6 m)   Neurology awake and alert ENT with mild pallor Cardiovascular with S1 and S2 present, irregularly irregular with no gallops, rubs or murmurs Mild JVD Trace lower extremity edema Respiratory with mild rales at bases with no wheezing, or rhonchi Abdomen with no distention   Data Reviewed:    Family Communication: I spoke with patient's daughter in law and daughter over the phone at the bedside, we talked in detail about patient's condition, plan of care and prognosis and all questions were addressed.   Disposition: Status is: Inpatient Remains inpatient appropriate because: diuresis   Planned Discharge  Destination: Home    Author: Coralie Keens, MD 03/02/2023 3:53 PM  For on call review [email or web address].

## 2023-03-02 NOTE — Assessment & Plan Note (Signed)
Cell count has been stable.

## 2023-03-02 NOTE — Assessment & Plan Note (Signed)
Continue rate control with carvedilol.  She has been off anticoagulation due to fall risk and frailty.  Continue telemetry monitoring.

## 2023-03-02 NOTE — Assessment & Plan Note (Signed)
Continue with diazepam per her home regimen.

## 2023-03-02 NOTE — Assessment & Plan Note (Addendum)
Echocardiogram with preserved LV systolic function with EF 55 to 60%, mild concentric LVH, RV with low normal systolic function, RVSP 40,3 mmHg, LA and RA with severe dilatation, no significant valvular disease.   Urine output is 2,980 ml Systolic blood pressure 181 to 149 mmHg.   Continue with carvedilol 25 mg, spironolactone and SGLT 2 inh.  Add low dose ARB and transition to oral loop diuretic.

## 2023-03-02 NOTE — Hospital Course (Addendum)
[name] was admitted to the hospital with the working diagnosis of heart failure exacerbation.   89 yo female with the past medical history of hypertension, hyperlipidemia, and atrial fibrillation who presented with dyspnea and lower extremity edema. She endorsed progressive dyspnea on exertion. She has been hesitant to take diuretics due to polyuria. On 09/19 she was evaluated by cardiology and was found volume overloaded, considering her frailty and ambulatory dysfunction she was referred to the hospital for further diuresis. On her initial physical examination her blood pressure was 198/104, HR 84, RR 23 and 02 saturation 94% on room air, heart with S1 and S2 present, regular rhythm with no gallops or murmurs, lungs with no rhonchi or wheezing, abdomen with no distention and positive lower extremity edema.   Na 140, K 3,9 Cl 100, bicarbonate 29, glucose 99, bun 17 cr 0,82  BNP 169.9 Wbc 3,6 hgb 10,9 plt 103  Urine analysis with SG 1,009, negative for proteins and negative for leukocytes.   Chest radiograph with left rotation, cardiomegaly, bilateral hilar vascular congestion, cephalization of the vasculature and fluid in the right fissure.   EKG 66 bpm, right axis deviation, normal intervals, atrial fibrillation rhythm with no significant ST segment or T wave changes. Poor R R wave progression.   Echocardiogram with preserved LV systolic function.  09/21 volume status has improved, pending PT and OT evaluation.  00/22 patient euvolemic, continue very weak and deconditioned, plan to transfer to SNF for short term rehabilitation.  09/23 patient is medically stable to be transfer to SNF.  09/25 plan to transfer to SNF.

## 2023-03-02 NOTE — ED Notes (Signed)
ED TO INPATIENT HANDOFF REPORT  ED Nurse Name and Phone #: Beatris Ship RN [phone]  S Name/Age/Gender Rebecca Bradshaw 89 y.o. female Room/Bed: 046C/046C  Code Status   Code Status: Limited: Do not attempt resuscitation (DNR) -DNR-LIMITED -Do Not Intubate/DNI   Home/SNF/Other Home Patient oriented to: self, place, time, and situation Is this baseline? Yes   Triage Complete: Triage complete  Chief Complaint Acute on chronic diastolic CHF (congestive heart failure) (HCC) [I50.33]  Triage Note [facility] from PCP. Pt noncompliant with diuretic. Pt went to PCP for physical and had BL crackles and BLE pitting edema. Denis SOB chest pain. A&ox4. BP 152/100 P 81 afib. SPO2 94 RA   Allergies Allergies  Allergen Reactions   Codeine Nausea And Vomiting   Metoprolol Other (See Comments)    "Nightmares"   Morphine And Codeine Nausea And Vomiting   Pantoprazole Other (See Comments)    nightmares    Level of Care/Admitting Diagnosis ED Disposition     ED Disposition  Admit   Condition  --   Comment  Hospital Area: [facility] [[id number]]  Level of Care: Telemetry Cardiac [[id number]]  May admit patient to [facility] or [facility] if equivalent level of care is available:: Yes  Covid Evaluation: Asymptomatic - no recent exposure (last 10 days) testing not required  Diagnosis: Acute on chronic diastolic CHF (congestive heart failure) [facility]) [[id number]]  Admitting Physician: Briscoe Deutscher [[id number]]  Attending Physician: Briscoe Deutscher [[id number]]  Certification:: I certify this patient will need inpatient services for at least 2 midnights  Expected Medical Readiness: 03/03/2023          B Medical/Surgery History Past Medical History:  Diagnosis Date   A-fib [facility])    Arrhythmia 2014   chronic/permanent atrial fibrillation -rate control with carvedilol and anticoag wth warfarin   Chronic diarrhea    Essential hypertension 06/30/2013   History of diverticulitis     Hyperlipidemia    on statin therapy   Past Surgical History:  Procedure Laterality Date   abdominal  aorta doppler  10/17/2007   normal taper--normal duplex   DOPPLER ECHOCARDIOGRAPHY  10/17/2007   EF=>55%; -LA -moderated diltaed; RA-moderately; borderline mitral valve prolapse of AMVL- moderate mitral regurg.; moderatetricuspid regurg.--rgt ventricular systolic pressure elevated 30-40 mmHg   NM MYOVIEW LTD  06/21/2011   EF 78%  LV systolic function normal - NORMAL STUDY   TRANSESOPHAGEAL ECHOCARDIOGRAM  12/10/2007   normal left ventricle ; normal aortic valve there was trivial  valvular regurg; left atium markedly dilated no evidence for atrial thrombus,; mild to moderate tricuspid valvular regurg. , right atrium markedly dilated     A IV Location/Drains/Wounds Patient Lines/Drains/Airways Status     Active Line/Drains/Airways     Name Placement date Placement time Site Days   Peripheral IV 03/01/23 20 G Left Antecubital 03/01/23  1604  Antecubital  1            Intake/Output Last 24 hours  Intake/Output Summary (Last 24 hours) at 03/02/2023 0729 Last data filed at 03/01/2023 2222 Gross per 24 hour  Intake 3 ml  Output 2800 ml  Net -2797 ml    Labs/Imaging Results for orders placed or performed during the hospital encounter of 03/01/23 (from the past 48 hour(s))  Urinalysis, Routine w reflex microscopic -Urine, Clean Catch     Status: None   Collection Time: 03/01/23  5:17 PM  Result Value Ref Range   Color, Urine YELLOW YELLOW  APPearance CLEAR CLEAR   Specific Gravity, Urine 1.009 1.005 - 1.030   pH 7.0 5.0 - 8.0   Glucose, UA NEGATIVE NEGATIVE mg/dL   Hgb urine dipstick NEGATIVE NEGATIVE   Bilirubin Urine NEGATIVE NEGATIVE   Ketones, ur NEGATIVE NEGATIVE mg/dL   Protein, ur NEGATIVE NEGATIVE mg/dL   Nitrite NEGATIVE NEGATIVE   Leukocytes,Ua NEGATIVE NEGATIVE    Comment: Performed at [facility], [address] [place], [place] [address]  Brain  natriuretic peptide     Status: Abnormal   Collection Time: 03/01/23  5:21 PM  Result Value Ref Range   B Natriuretic Peptide 169.9 (H) 0.0 - 100.0 pg/mL    Comment: Performed at [facility], [address], [place], [place] [address]  Comprehensive metabolic panel     Status: Abnormal   Collection Time: 03/01/23  5:21 PM  Result Value Ref Range   Sodium 140 135 - 145 mmol/L   Potassium 3.9 3.5 - 5.1 mmol/L   Chloride 100 98 - 111 mmol/L   CO2 29 22 - 32 mmol/L   Glucose, Bld 99 70 - 99 mg/dL    Comment: Glucose reference range applies only to samples taken after fasting for at least 8 hours.   BUN 17 8 - 23 mg/dL   Creatinine, Ser 0.98 0.44 - 1.00 mg/dL   Calcium 9.0 8.9 - 11.9 mg/dL   Total Protein 6.4 (L) 6.5 - 8.1 g/dL   Albumin 3.8 3.5 - 5.0 g/dL   AST 24 15 - 41 U/L   ALT 19 0 - 44 U/L   Alkaline Phosphatase 61 38 - 126 U/L   Total Bilirubin 0.9 0.3 - 1.2 mg/dL   GFR, Estimated >14 >78 mL/min    Comment: (NOTE) Calculated using the CKD-EPI Creatinine Equation (2021)    Anion gap 11 5 - 15    Comment: Performed at [facility], [address] [place], [place] [address]  Troponin I (High Sensitivity)     Status: None   Collection Time: 03/01/23  5:21 PM  Result Value Ref Range   Troponin I (High Sensitivity) 8 <18 ng/L    Comment: (NOTE) Elevated high sensitivity troponin I (hsTnI) values and significant  changes across serial measurements may suggest ACS but many other  chronic and acute conditions are known to elevate hsTnI results.  Refer to the "Links" section for chest pain algorithms and additional  guidance. Performed at [facility], [address] [place], [place] [address]   CBC with Differential     Status: Abnormal   Collection Time: 03/01/23  5:21 PM  Result Value Ref Range   WBC 3.6 (L) 4.0 - 10.5 K/uL   RBC 3.94 3.87 - 5.11 MIL/uL   Hemoglobin 10.9 (L) 12.0 - 15.0 g/dL   HCT 57.8 (L) 46.9 - 62.9 %   MCV 88.8 80.0 - 100.0 fL    MCH 27.7 26.0 - 34.0 pg   MCHC 31.1 30.0 - 36.0 g/dL   RDW 52.8 41.3 - 24.4 %   Platelets 103 (L) 150 - 400 K/uL   nRBC 0.0 0.0 - 0.2 %   Neutrophils Relative % 46 %   Neutro Abs 1.7 1.7 - 7.7 K/uL   Lymphocytes Relative 38 %   Lymphs Abs 1.4 0.7 - 4.0 K/uL   Monocytes Relative 13 %   Monocytes Absolute 0.5 0.1 - 1.0 K/uL   Eosinophils Relative 2 %   Eosinophils Absolute 0.1 0.0 -  0.5 K/uL   Basophils Relative 1 %   Basophils Absolute 0.0 0.0 - 0.1 K/uL   Immature Granulocytes 0 %   Abs Immature Granulocytes 0.01 0.00 - 0.07 K/uL    Comment: Performed at [facility], [address] [place], [place] [address]  Technologist smear review     Status: None   Collection Time: 03/01/23  5:21 PM  Result Value Ref Range   WBC MORPHOLOGY MORPHOLOGY UNREMARKABLE    RBC MORPHOLOGY PLATELETS APPEAR DECREASED    Plt Morphology OVALOCYTES    Clinical Information pancytopenia, admitted for acute CHF     Comment: Performed at [facility], [address], [place], [place] [address]  Basic metabolic panel     Status: Abnormal   Collection Time: 03/02/23  3:34 AM  Result Value Ref Range   Sodium 137 135 - 145 mmol/L   Potassium 3.2 (L) 3.5 - 5.1 mmol/L   Chloride 97 (L) 98 - 111 mmol/L   CO2 31 22 - 32 mmol/L   Glucose, Bld 92 70 - 99 mg/dL    Comment: Glucose reference range applies only to samples taken after fasting for at least 8 hours.   BUN 14 8 - 23 mg/dL   Creatinine, Ser 5.62 0.44 - 1.00 mg/dL   Calcium 8.6 (L) 8.9 - 10.3 mg/dL   GFR, Estimated >13 >08 mL/min    Comment: (NOTE) Calculated using the CKD-EPI Creatinine Equation (2021)    Anion gap 9 5 - 15    Comment: Performed at [facility], [address], [place], [place] [address]  Magnesium     Status: None   Collection Time: 03/02/23  3:34 AM  Result Value Ref Range   Magnesium 1.9 1.7 - 2.4 mg/dL    Comment: Performed at [facility], [address], [place], [place] [address]  CBC     Status:  Abnormal   Collection Time: 03/02/23  3:34 AM  Result Value Ref Range   WBC 3.6 (L) 4.0 - 10.5 K/uL   RBC 3.86 (L) 3.87 - 5.11 MIL/uL   Hemoglobin 10.7 (L) 12.0 - 15.0 g/dL   HCT 52.8 (L) 41.3 - 24.4 %   MCV 87.6 80.0 - 100.0 fL   MCH 27.7 26.0 - 34.0 pg   MCHC 31.7 30.0 - 36.0 g/dL   RDW 01.0 27.2 - 53.6 %   Platelets 105 (L) 150 - 400 K/uL    Comment: Immature Platelet Fraction may be clinically indicated, consider ordering this additional test [id number] REPEATED TO VERIFY    nRBC 0.0 0.0 - 0.2 %    Comment: Performed at [facility], [address], [place], [place] [address]  Reticulocytes     Status: Abnormal   Collection Time: 03/02/23  3:34 AM  Result Value Ref Range   Retic Ct Pct 0.9 0.4 - 3.1 %   RBC. 3.84 (L) 3.87 - 5.11 MIL/uL   Retic Count, Absolute 34.2 19.0 - 186.0 K/uL   Immature Retic Fract 5.1 2.3 - 15.9 %    Comment: Performed at [facility], [address] [place], [place] [address]  Protime-INR     Status: Abnormal   Collection Time: 03/02/23  3:34 AM  Result Value Ref Range   Prothrombin Time 15.5 (H) 11.4 - 15.2 seconds   INR 1.2 0.8 - 1.2    Comment: (NOTE) INR goal varies based on device and disease states. Performed at [facility], [address], [place], [place] [address]    DG Abd Portable 1V  Result Date: 03/01/2023 CLINICAL DATA:  Lower abdominal pain EXAM: PORTABLE ABDOMEN - 1 VIEW COMPARISON:  12/31/2015 FINDINGS: Scattered large and small bowel gas is noted. Mild retained fecal material is noted. No obstructive changes are seen. No free air is noted. Degenerative changes of the lumbar spine are noted. IMPRESSION: No acute abnormality seen. Electronically Signed   By: Alcide Clever M.D.   On: 03/01/2023 21:25   DG Chest Port 1 View  Result Date: 03/01/2023 CLINICAL DATA:  Shortness of breath EXAM: PORTABLE CHEST 1 VIEW COMPARISON:  Abdominal series with chest x-ray 12/31/2015 FINDINGS: The heart is enlarged. The left  costophrenic angle has been excluded from the exam. The lungs otherwise appear clear. There is no pneumothorax or acute fracture. IMPRESSION: Cardiomegaly. No acute cardiopulmonary process. Electronically Signed   By: Darliss Cheney M.D.   On: 03/01/2023 19:17    Pending Labs Unresulted Labs (From admission, onward)     Start     Ordered   03/02/23 0500  Basic metabolic panel  Daily,   R      03/01/23 2001   03/02/23 0500  CBC  Daily,   R      03/01/23 2001            Vitals/Pain Today's Vitals   03/02/23 0530 03/02/23 0600 03/02/23 0630 03/02/23 0700  BP: (!) 177/106 (!) 163/86 (!) 146/74 (!) 164/84  Pulse: 81 86 78 79  Resp: 18 17 18  (!) 21  Temp: 97.7 F (36.5 C)     TempSrc:      SpO2: 92% 93% 91% 92%  Weight:      Height:      PainSc:        Isolation Precautions No active isolations  Medications Medications  aspirin EC tablet 81 mg (has no administration in time range)  carvedilol (COREG) tablet 25 mg (25 mg Oral Given 03/01/23 2100)  pravastatin (PRAVACHOL) tablet 40 mg (has no administration in time range)  diazepam (VALIUM) tablet 2.5-5 mg (has no administration in time range)  furosemide (LASIX) injection 40 mg (has no administration in time range)  sodium chloride flush (NS) 0.9 % injection 3 mL (3 mLs Intravenous Given 03/01/23 2222)  acetaminophen (TYLENOL) tablet 650 mg (has no administration in time range)    Or  acetaminophen (TYLENOL) suppository 650 mg (has no administration in time range)  senna-docusate (Senokot-S) tablet 1 tablet (has no administration in time range)  ondansetron (ZOFRAN) tablet 4 mg (has no administration in time range)    Or  ondansetron (ZOFRAN) injection 4 mg (has no administration in time range)  hydrALAZINE (APRESOLINE) tablet 25 mg (has no administration in time range)  furosemide (LASIX) injection 40 mg (40 mg Intravenous Given 03/01/23 1745)  labetalol (NORMODYNE) injection 10 mg (10 mg Intravenous Given 03/01/23 1930)     Mobility walks     Focused Assessments Cardiac Assessment Handoff:  Cardiac Rhythm: Atrial fibrillation Lab Results  Component Value Date   CKTOTAL 53 11/07/2007   CKMB 0.7 11/07/2007   TROPONINI 0.02        NO INDICATION OF MYOCARDIAL INJURY. 11/07/2007   No results found for: "DDIMER" Does the Patient currently have chest pain? No    R Recommendations: See Admitting Provider Note  Report given to: 3E11

## 2023-03-03 DIAGNOSIS — I4821 Permanent atrial fibrillation: Secondary | ICD-10-CM | POA: Diagnosis not present

## 2023-03-03 DIAGNOSIS — I5033 Acute on chronic diastolic (congestive) heart failure: Secondary | ICD-10-CM | POA: Diagnosis not present

## 2023-03-03 DIAGNOSIS — D61818 Other pancytopenia: Secondary | ICD-10-CM | POA: Diagnosis not present

## 2023-03-03 DIAGNOSIS — I160 Hypertensive urgency: Secondary | ICD-10-CM | POA: Diagnosis not present

## 2023-03-03 LAB — BASIC METABOLIC PANEL
Anion gap: 9 (ref 5–15)
BUN: 16 mg/dL (ref 8–23)
CO2: 32 mmol/L (ref 22–32)
Calcium: 9.2 mg/dL (ref 8.9–10.3)
Chloride: 95 mmol/L — ABNORMAL LOW (ref 98–111)
Creatinine, Ser: 1.01 mg/dL — ABNORMAL HIGH (ref 0.44–1.00)
GFR, Estimated: 53 mL/min — ABNORMAL LOW (ref >60)
Glucose, Bld: 112 mg/dL — ABNORMAL HIGH (ref 70–99)
Potassium: 4 mmol/L (ref 3.5–5.1)
Sodium: 136 mmol/L (ref 135–145)

## 2023-03-03 LAB — MAGNESIUM: Magnesium: 2.1 mg/dL (ref 1.7–2.4)

## 2023-03-03 MED ORDER — LOSARTAN POTASSIUM 25 MG PO TABS
25.0000 mg | ORAL_TABLET | Freq: Every day | ORAL | Status: DC
  Administered 2023-03-03 – 2023-03-07 (×5): 25 mg via ORAL
  Filled 2023-03-03 (×5): qty 1

## 2023-03-03 MED ORDER — FUROSEMIDE 20 MG PO TABS
20.0000 mg | ORAL_TABLET | Freq: Every day | ORAL | Status: DC
  Administered 2023-03-04 – 2023-03-07 (×4): 20 mg via ORAL
  Filled 2023-03-03 (×4): qty 1

## 2023-03-03 NOTE — Progress Notes (Signed)
Progress Note   Patient: Rebecca Bradshaw ZOX:[id number] DOB: Oct 10, 1930 DOA: 03/01/2023     2 DOS: the patient was seen and examined on 03/03/2023   Brief hospital course: Rebecca Bradshaw was admitted to the hospital with the working diagnosis of heart failure exacerbation.   89 yo female with the past medical history of hypertension, hyperlipidemia, and atrial fibrillation who presented with dyspnea and lower extremity edema. She endorsed progressive dyspnea on exertion. She has been hesitant to take diuretics due to polyuria. On 09/19 she was evaluated by cardiology and was found volume overloaded, considering her frailty and ambulatory dysfunction she was referred to the hospital for further diuresis. On her initial physical examination her blood pressure was 198/104, HR 84, RR 23 and 02 saturation 94% on room air, heart with S1 and S2 present, regular rhythm with no gallops or murmurs, lungs with no rhonchi or wheezing, abdomen with no distention and positive lower extremity edema.   Na 140, K 3,9 Cl 100, bicarbonate 29, glucose 99, bun 17 cr 0,82  BNP 169.9 Wbc 3,6 hgb 10,9 plt 103  Urine analysis with SG 1,009, negative for proteins and negative for leukocytes.   Chest radiograph with left rotation, cardiomegaly, bilateral hilar vascular congestion, cephalization of the vasculature and fluid in the right fissure.   EKG 66 bpm, right axis deviation, normal intervals, atrial fibrillation rhythm with no significant ST segment or T wave changes. Poor R R wave progression.   Echocardiogram with preserved LV systolic function.  09/21 volume status has improved, pending PT and OT evaluation.   Assessment and Plan: * Acute on chronic diastolic CHF (congestive heart failure) (HCC) Echocardiogram with preserved LV systolic function with EF 55 to 60%, mild concentric LVH, RV with low normal systolic function, RVSP 40,3 mmHg, LA and RA with severe dilatation, no significant valvular disease.    Urine output is 2,980 ml Systolic blood pressure 181 to 149 mmHg.   Continue with carvedilol 25 mg, spironolactone and SGLT 2 inh.  Add low dose ARB and transition to oral loop diuretic.    Atrial fibrillation (HCC) Continue rate control with carvedilol.  She has been off anticoagulation due to fall risk and frailty.  Continue telemetry monitoring.    Hypertensive urgency Continue with carvedilol and spironolactone.  Will add losartan for better blood pressure control.   Pancytopenia (HCC) Cell count has been stable.   Anxiety Continue with diazepam per her home regimen.         Subjective: Patient with improvement in her dyspnea, she had mild confusion this morning. Last night not able to sleep well.   Physical Exam: Vitals:   03/03/23 0030 03/03/23 0317 03/03/23 0413 03/03/23 0817  BP: (!) 173/97 (!) 181/94  (!) 149/86  Pulse: 80 81  84  Resp: 20 20 20 19   Temp: 98.8 F (37.1 C) 97.8 F (36.6 C)  97.6 F (36.4 C)  TempSrc: Oral Oral  Oral  SpO2: 91% 90%  96%  Weight:   46.2 kg   Height:       Neurology awake and alert, no confusion or agitation  ENT with mild pallor Cardiovascular with S1 and S2 present, irregularly irregular with no gallops, rubs or murmurs No JVD No lower extremity edema Respiratory with kyphosis, no rales or wheezing, no rhonchi Abdomen with no distention  Data Reviewed:    Family Communication: I spoke with patient's daughter and son, daughter in law at the bedside, we talked in detail about patient's condition,  plan of care and prognosis and all questions were addressed.   Disposition: Status is: Inpatient Remains inpatient appropriate because: heart failure management   Planned Discharge Destination: Home     Author: Coralie Keens, MD 03/03/2023 10:53 AM  For on call review [email or web address].

## 2023-03-03 NOTE — Evaluation (Signed)
Occupational Therapy Evaluation Patient Details Name: Rebecca Bradshaw MRN: [id number] DOB: 07/16/1930 Today's Date: 03/03/2023   History of Present Illness Rebecca Bradshaw presented with progressive dyspnea on exertion and was admitted to the hospital with the working diagnosis of heart failure exacerbation. PMHx: hypertension, hyperlipidemia, and atrial fibrillation   Clinical Impression   Rebecca Bradshaw was evaluated s/p the above admission list. She lives alone and needs assist with ADLs at baseline, per family pt has been generally declining and has become quite sedentary. Upon evaluation the pt was limited by weakness, back pain, tremors, urgent BM, unsteady balance and poor activity tolerance. Overall she needed max A for bed mobility, mod A to stand and max A to pivot to/from [facility]. Due to the deficits listed below the pt also needs up to max A for LB ADLs and set up A for UB ADLs. Pt will benefit from continued acute OT services and skilled inpatient follow up therapy, <3 hours/day.           Functional Status Assessment  Patient has had a recent decline in their functional status and demonstrates the ability to make significant improvements in function in a reasonable and predictable amount of time.  Equipment Recommendations  None recommended by OT       Precautions / Restrictions Precautions Precautions: Fall Restrictions Weight Bearing Restrictions: No      Mobility Bed Mobility Overal bed mobility: Needs Assistance Bed Mobility: Supine to Sit, Sit to Supine     Supine to sit: Max assist Sit to supine: Max assist        Transfers Overall transfer level: Needs assistance Equipment used: Rolling walker (2 wheels) Transfers: Sit to/from Stand, Bed to chair/wheelchair/BSC Sit to Stand: Mod assist Stand pivot transfers: Max assist         General transfer comment: HHA from bed>BSC, RW for BSC>bed      Balance Overall balance assessment: Needs  assistance Sitting-balance support: Feet supported Sitting balance-Leahy Scale: Fair     Standing balance support: Bilateral upper extremity supported, During functional activity Standing balance-Leahy Scale: Poor                             ADL either performed or assessed with clinical judgement   ADL Overall ADL's : Needs assistance/impaired Eating/Feeding: Independent   Grooming: Set up;Sitting   Upper Body Bathing: Set up;Sitting   Lower Body Bathing: Maximal assistance;Sit to/from stand   Upper Body Dressing : Set up;Sitting   Lower Body Dressing: Maximal assistance;Sit to/from stand   Toilet Transfer: Maximal assistance;Stand-pivot;BSC/3in1   Toileting- Clothing Manipulation and Hygiene: Maximal assistance;Sit to/from stand       Functional mobility during ADLs: Maximal assistance General ADL Comments: limited by weakness and balance     Vision Baseline Vision/History: 0 No visual deficits Vision Assessment?: No apparent visual deficits     Perception Perception: Not tested       Praxis Praxis: Not tested       Pertinent Vitals/Pain Pain Assessment Pain Assessment: Faces Faces Pain Scale: Hurts little more Pain Location: back Pain Descriptors / Indicators: Discomfort Pain Intervention(s): Limited activity within patient's tolerance, Monitored during session     Extremity/Trunk Assessment Upper Extremity Assessment Upper Extremity Assessment: Generalized weakness   Lower Extremity Assessment Lower Extremity Assessment: Defer to PT evaluation   Cervical / Trunk Assessment Cervical / Trunk Assessment: Kyphotic   Communication Communication Communication: No apparent difficulties   Cognition Arousal: Alert  Behavior During Therapy: Flat affect, Anxious, WFL for tasks assessed/performed Overall Cognitive Status: Within Functional Limits for tasks assessed                                 General Comments: overall WFL  for basic orientation and simple functional tasks. accepting that she needs rehab and is unsafe to go home alone     General Comments  VSS, family present and supportive    Exercises     Shoulder Instructions      Home Living Family/patient expects to be discharged to:: Private residence Living Arrangements: Alone Available Help at Discharge: Family;Available PRN/intermittently Type of Home: House                           Additional Comments: Home set up not obtained - Family planning for SNF and ALF after      Prior Functioning/Environment Prior Level of Function : Independent/Modified Independent             Mobility Comments: sedentary, per family she has pressure injuries from sitting so much. uses RW ADLs Comments: family assists as much as they can, pt typically able to get to toilet mod I        OT Problem List: Decreased range of motion;Decreased strength;Decreased activity tolerance;Impaired balance (sitting and/or standing);Decreased knowledge of precautions;Decreased knowledge of use of DME or AE;Decreased safety awareness      OT Treatment/Interventions: Self-care/ADL training;DME and/or AE instruction;Therapeutic activities;Patient/family education;Balance training    OT Goals(Current goals can be found in the care plan section) Acute Rehab OT Goals Patient Stated Goal: to get better OT Goal Formulation: With patient Potential to Achieve Goals: Good ADL Goals Pt Will Perform Lower Body Dressing: with min assist;sit to/from stand Pt Will Transfer to Toilet: with min assist;bedside commode Additional ADL Goal #1: pt will complete bed mobility with min A as a precursor to ADLs  OT Frequency: Min 1X/week    Co-evaluation              AM-PAC OT "6 Clicks" Daily Activity     Outcome Measure Help from another person eating meals?: None Help from another person taking care of personal grooming?: A Little Help from another person toileting,  which includes using toliet, bedpan, or urinal?: A Lot Help from another person bathing (including washing, rinsing, drying)?: A Lot Help from another person to put on and taking off regular upper body clothing?: A Little Help from another person to put on and taking off regular lower body clothing?: A Lot 6 Click Score: 16   End of Session Equipment Utilized During Treatment: Gait belt;Rolling walker (2 wheels) Nurse Communication: Mobility status (needs new purwick)  Activity Tolerance: Patient tolerated treatment well Patient left: in bed;with call bell/phone within reach;with bed alarm set;with family/visitor present  OT Visit Diagnosis: Unsteadiness on feet (R26.81);Other abnormalities of gait and mobility (R26.89);Muscle weakness (generalized) (M62.81)                Time: 5784-6962 OT Time Calculation (min): 27 min Charges:  OT General Charges $OT Visit: 1 Visit OT Evaluation $OT Eval Moderate Complexity: 1 Mod OT Treatments $Self Care/Home Management : 8-22 mins  Derenda Mis, OTR/L Acute Rehabilitation Services Office [phone] Secure Chat Communication Preferred   Donia Pounds 03/03/2023, 1:50 PM

## 2023-03-03 NOTE — Evaluation (Signed)
Physical Therapy Evaluation Patient Details Name: Rebecca Bradshaw MRN: [id number] DOB: [birth year] Today's Date: 03/03/2023  History of Present Illness  89 y.o. female presents to [facility] hospital on 03/01/2023 with progressive dyspnea on exertion and was admitted to the hospital with the working diagnosis of heart failure exacerbation. PMHx: hypertension, hyperlipidemia, and atrial fibrillation  Clinical Impression  Pt presents to PT with deficits in functional mobility, gait, balance, strength, endurance, power. Pt is generally weak, requiring physical assistance for bed mobility and transfers. Pt is at a high risk for falls due to LE weakness, and today is unable to stand without physical assistance. PT does not improvement in transfer quality during session. PT recommends short term inpatient PT services at the time of discharge in an effort to return to ambulation and to reduce caregiver burden.        If plan is discharge home, recommend the following: A lot of help with walking and/or transfers;A lot of help with bathing/dressing/bathroom;Assistance with cooking/housework;Assist for transportation;Help with stairs or ramp for entrance   Can travel by private vehicle   No    Equipment Recommendations Wheelchair (measurements PT);Hospital bed;BSC/3in1;Rolling walker (2 wheels)  Recommendations for Other Services       Functional Status Assessment Patient has had a recent decline in their functional status and demonstrates the ability to make significant improvements in function in a reasonable and predictable amount of time.     Precautions / Restrictions Precautions Precautions: Fall Restrictions Weight Bearing Restrictions: No      Mobility  Bed Mobility Overal bed mobility: Needs Assistance Bed Mobility: Supine to Sit, Sit to Supine     Supine to sit: Mod assist, HOB elevated Sit to supine: Mod assist        Transfers Overall transfer level: Needs assistance Equipment  used: Rolling walker (2 wheels), 1 person hand held assist Transfers: Sit to/from Stand Sit to Stand: Mod assist, Min assist           General transfer comment: modA for initial stand with PT assist and initial stand with RW, minA with RW for final stand with verbal cues for hand placement and increaed trunk flexion    Ambulation/Gait                  Stairs            Wheelchair Mobility     Tilt Bed    Modified Rankin (Stroke Patients Only)       Balance Overall balance assessment: Needs assistance Sitting-balance support: No upper extremity supported, Feet supported Sitting balance-Leahy Scale: Fair     Standing balance support: Bilateral upper extremity supported, Reliant on assistive device for balance Standing balance-Leahy Scale: Poor                               Pertinent Vitals/Pain Pain Assessment Pain Assessment: Faces Faces Pain Scale: Hurts little more Pain Location: back Pain Descriptors / Indicators: Aching Pain Intervention(s): Monitored during session    Home Living Family/patient expects to be discharged to:: Private residence Living Arrangements: Alone Available Help at Discharge: Family;Available PRN/intermittently Type of Home: House             Additional Comments: Home set up not obtained - Family planning for SNF and ALF after    Prior Function Prior Level of Function : Independent/Modified Independent             Mobility  Comments: ambulatory for short household distances with RW until recent decline. ADLs Comments: family assists as much as they can, pt typically able to get to toilet mod I     Extremity/Trunk Assessment   Upper Extremity Assessment Upper Extremity Assessment: Generalized weakness    Lower Extremity Assessment Lower Extremity Assessment: Generalized weakness    Cervical / Trunk Assessment Cervical / Trunk Assessment: Kyphotic  Communication    Communication Communication: No apparent difficulties Cueing Techniques: Verbal cues  Cognition Arousal: Alert Behavior During Therapy: WFL for tasks assessed/performed Overall Cognitive Status: Within Functional Limits for tasks assessed                                          General Comments General comments (skin integrity, edema, etc.): VSS on RA, pt in afib but rate controlled in 70s    Exercises     Assessment/Plan    PT Assessment Patient needs continued PT services  PT Problem List Decreased strength;Decreased balance;Decreased activity tolerance;Decreased mobility;Decreased knowledge of use of DME       PT Treatment Interventions DME instruction;Gait training;Functional mobility training;Therapeutic activities;Therapeutic exercise;Balance training;Neuromuscular re-education;Patient/family education;Wheelchair mobility training    PT Goals (Current goals can be found in the Care Plan section)  Acute Rehab PT Goals Patient Stated Goal: to improve strength and return to ambulation PT Goal Formulation: With patient Time For Goal Achievement: 03/17/23 Potential to Achieve Goals: Fair    Frequency Min 1X/week     Co-evaluation               AM-PAC PT "6 Clicks" Mobility  Outcome Measure Help needed turning from your back to your side while in a flat bed without using bedrails?: A Little Help needed moving from lying on your back to sitting on the side of a flat bed without using bedrails?: A Lot Help needed moving to and from a bed to a chair (including a wheelchair)?: A Lot Help needed standing up from a chair using your arms (e.g., wheelchair or bedside chair)?: A Lot Help needed to walk in hospital room?: Total Help needed climbing 3-5 steps with a railing? : Total 6 Click Score: 11    End of Session   Activity Tolerance: Patient limited by fatigue Patient left: in bed;with call bell/phone within reach;with bed alarm set Nurse  Communication: Mobility status PT Visit Diagnosis: Other abnormalities of gait and mobility (R26.89);Muscle weakness (generalized) (M62.81)    Time: 3016-0109 PT Time Calculation (min) (ACUTE ONLY): 14 min   Charges:   PT Evaluation $PT Eval Low Complexity: 1 Low   PT General Charges $$ ACUTE PT VISIT: 1 Visit         Arlyss Gandy, PT, DPT Acute Rehabilitation Office [phone]   Arlyss Gandy 03/03/2023, 3:08 PM

## 2023-03-04 DIAGNOSIS — I4821 Permanent atrial fibrillation: Secondary | ICD-10-CM | POA: Diagnosis not present

## 2023-03-04 DIAGNOSIS — I5033 Acute on chronic diastolic (congestive) heart failure: Secondary | ICD-10-CM | POA: Diagnosis not present

## 2023-03-04 DIAGNOSIS — F419 Anxiety disorder, unspecified: Secondary | ICD-10-CM | POA: Diagnosis not present

## 2023-03-04 DIAGNOSIS — D61818 Other pancytopenia: Secondary | ICD-10-CM | POA: Diagnosis not present

## 2023-03-04 DIAGNOSIS — E876 Hypokalemia: Secondary | ICD-10-CM

## 2023-03-04 LAB — MAGNESIUM: Magnesium: 2.3 mg/dL (ref 1.7–2.4)

## 2023-03-04 LAB — BASIC METABOLIC PANEL
Anion gap: 11 (ref 5–15)
BUN: 24 mg/dL — ABNORMAL HIGH (ref 8–23)
CO2: 32 mmol/L (ref 22–32)
Calcium: 9.3 mg/dL (ref 8.9–10.3)
Chloride: 92 mmol/L — ABNORMAL LOW (ref 98–111)
Creatinine, Ser: 0.99 mg/dL (ref 0.44–1.00)
GFR, Estimated: 54 mL/min — ABNORMAL LOW (ref >60)
Glucose, Bld: 110 mg/dL — ABNORMAL HIGH (ref 70–99)
Potassium: 3.8 mmol/L (ref 3.5–5.1)
Sodium: 135 mmol/L (ref 135–145)

## 2023-03-04 MED ORDER — POTASSIUM CHLORIDE CRYS ER 20 MEQ PO TBCR
40.0000 meq | EXTENDED_RELEASE_TABLET | Freq: Once | ORAL | Status: AC
  Administered 2023-03-04: 40 meq via ORAL

## 2023-03-04 MED ORDER — POTASSIUM CHLORIDE CRYS ER 20 MEQ PO TBCR
40.0000 meq | EXTENDED_RELEASE_TABLET | Freq: Once | ORAL | Status: DC
  Filled 2023-03-04: qty 2

## 2023-03-04 NOTE — Progress Notes (Addendum)
Progress Note   Patient: Rebecca Bradshaw:[id number] DOB: [birth year] DOA: 03/01/2023     3 DOS: the patient was seen and examined on 03/04/2023   Brief hospital course: Rebecca Bradshaw was admitted to the hospital with the working diagnosis of heart failure exacerbation.   89 yo female with the past medical history of hypertension, hyperlipidemia, and atrial fibrillation who presented with dyspnea and lower extremity edema. She endorsed progressive dyspnea on exertion. She has been hesitant to take diuretics due to polyuria. On 09/19 she was evaluated by cardiology and was found volume overloaded, considering her frailty and ambulatory dysfunction she was referred to the hospital for further diuresis. On her initial physical examination her blood pressure was 198/104, HR 84, RR 23 and 02 saturation 94% on room air, heart with S1 and S2 present, regular rhythm with no gallops or murmurs, lungs with no rhonchi or wheezing, abdomen with no distention and positive lower extremity edema.   Na 140, K 3,9 Cl 100, bicarbonate 29, glucose 99, bun 17 cr 0,82  BNP 169.9 Wbc 3,6 hgb 10,9 plt 103  Urine analysis with SG 1,009, negative for proteins and negative for leukocytes.   Chest radiograph with left rotation, cardiomegaly, bilateral hilar vascular congestion, cephalization of the vasculature and fluid in the right fissure.   EKG 66 bpm, right axis deviation, normal intervals, atrial fibrillation rhythm with no significant ST segment or T wave changes. Poor R R wave progression.   Echocardiogram with preserved LV systolic function.  09/21 volume status has improved, pending PT and OT evaluation.  00/22 patient euvolemic, continue very weak and deconditioned, plan to transfer to SNF for short term rehabilitation.   Assessment and Plan: * Acute on chronic diastolic CHF (congestive heart failure) (HCC) Echocardiogram with preserved LV systolic function with EF 55 to 60%, mild concentric LVH, RV with  low normal systolic function, RVSP 40,3 mmHg, LA and RA with severe dilatation, no significant valvular disease.   Acute on chronic core pulmonale.  Systolic blood pressure 181 to 149 mmHg.   Patient has lost about 9 kg since admission.   Continue with carvedilol 25 mg, spironolactone and SGLT 2 inh.  Losartan and furosemide.   Atrial fibrillation (HCC) Continue rate control with carvedilol.  She has been off anticoagulation due to fall risk and frailty.  Continue telemetry monitoring.    Hypertensive urgency Continue with losartan, carvedilol and spironolactone.   Hypokalemia K has improved to 3,8, serum cr is 0,99 and bicarbonate at 32. Na 135 and Mg 2.3   Plan to add 40 meq Kcl to keep K above 4.  Follow up renal function in am.  Continue diuresis with furosemide, empaglifozin and spironolactone.   Pancytopenia (HCC) Cell count has been stable.   Anxiety Continue with diazepam per her home regimen.         Subjective: Patient with no chest pain, dyspnea has improved, no lower extremity edema, improved cramps of the left lower extremity   Physical Exam: Vitals:   03/03/23 1945 03/03/23 2338 03/04/23 0444 03/04/23 0730  BP: (!) 101/57 120/70 133/72 (!) 142/80  Pulse: 82 81 84 83  Resp: 19 17 20 18   Temp: 98.7 F (37.1 C) 98.7 F (37.1 C) 99 F (37.2 C) 99 F (37.2 C)  TempSrc: Oral Oral Oral Oral  SpO2: 90% 93% 92% 96%  Weight:   49.1 kg   Height:       Neurology awake and alert ENT with mild pallor Cardiovascular with  S1 and S2 present, irregularly irregular with positive systolic murmur at the right lower sternal border. No JVD No lower extremity edema Respiratory with no rales, rhonchi or wheezing, positive kyphosis Abdomen with no distention   Data Reviewed:    Family Communication: no family at the bedside   Disposition: Status is: Inpatient Remains inpatient appropriate because: pending transfer to SNF   Planned Discharge Destination:  Skilled nursing facility      Author: Coralie Keens, MD 03/04/2023 11:11 AM  For on call review [email or web address].

## 2023-03-04 NOTE — TOC Progression Note (Signed)
Transition of Care [facility]) - Progression Note    Patient Details  Name: Rebecca Bradshaw MRN: [id number] Date of Birth: 06-15-1930  Transition [facility]) CM/SW Contact  [name], [place] Phone Number: 03/04/2023, 11:04 AM  Clinical Narrative:     CSW met with pt at bedside. She requested CSW reach out to her daughter, [name], her Health Care POA for discharge planning. CSW contacted [name] and discussed plan for SNF. She said she was agreeable and said pt had been to [facility] in the past.   CSW completed SNF workup, discharge pending medically stability and insurance authorization.   TOC will continue to follow.  Expected Discharge Plan: Home w Home Health Services Barriers to Discharge: Continued Medical Work up  Expected Discharge Plan and Services In-house Referral: NA Discharge Planning Services: CM Consult Post Acute Care Choice: NA Living arrangements for the past 2 months: Single Family Home                 DME Arranged: N/A DME Agency: NA       HH Arranged: NA           Social Determinants of Health (SDOH) Interventions SDOH Screenings   Food Insecurity: No Food Insecurity (03/02/2023)  Housing: Low Risk  (03/02/2023)  Transportation Needs: No Transportation Needs (03/02/2023)  Utilities: Not At Risk (03/02/2023)  Social Connections: Unknown (01/26/2022)   Received from [facility], [facility]  Tobacco Use: Low Risk  (03/01/2023)    Readmission Risk Interventions     No data to display

## 2023-03-04 NOTE — Progress Notes (Signed)
Mobility Specialist Progress Note:    03/04/23 1053  Mobility  Activity Transferred from bed to chair  Level of Assistance Minimal assist, patient does 75% or more  Assistive Device Front wheel walker  Distance Ambulated (ft) 5 ft  Activity Response Tolerated well  Mobility Referral Yes  $Mobility charge 1 Mobility  Mobility Specialist Start Time (ACUTE ONLY) 0848  Mobility Specialist Stop Time (ACUTE ONLY) [id number]  Mobility Specialist Time Calculation (min) (ACUTE ONLY) 15 min   Pt received in bed, agreeable to mobility. Pt needed MinA w/ bed mobility and STS. Was able to take a couple of steps towards the chair w/o fault. Situated in chair w/ call bell and personal belongings in reach. All needs met.  Thompson Grayer Mobility Specialist  Please contact vis Secure Chat or  Rehab Office [phone]

## 2023-03-04 NOTE — [place] FL2 (Signed)
[place] MEDICAID FL2 LEVEL OF CARE FORM     IDENTIFICATION  Patient Name: Rebecca Bradshaw Birthdate: 11-27-1930 Sex: female Admission Date (Current Location): 03/01/2023  [facility] and [place] Number:  Producer, television/film/video and Address:  [facility], [address] [place], [place] [address]      Provider Number: [id number]  Attending Physician Name and Address:  Coralie Keens,*  Relative Name and Phone Number:  Leilah, [place])  [phone]    Current Level of Care: Hospital Recommended Level of Care: Skilled Nursing Facility Prior Approval Number:    Date Approved/Denied:   PASRR Number: [id number]  Discharge Plan: SNF    Current Diagnoses: Patient Active Problem List   Diagnosis Date Noted   Anxiety 03/02/2023   Acute on chronic diastolic CHF (congestive heart failure) (HCC) 03/01/2023   Hypertensive urgency 03/01/2023   Pancytopenia (HCC) 03/01/2023   Burst fracture of lumbar vertebra (HCC) 06/28/2017   Fall 09/19/2014   Essential hypertension 06/30/2013   Hypercholesterolemia 06/30/2013   Frequency of micturition 06/30/2013   Atrial fibrillation (HCC) 08/28/2012   Long-term (current) use of anticoagulants 08/28/2012    Orientation RESPIRATION BLADDER Height & Weight     Self, Time, Place, Situation  Normal Continent, External catheter Weight: 108 lb 3.9 oz (49.1 kg) Height:  5\' 3"  (160 cm)  BEHAVIORAL SYMPTOMS/MOOD NEUROLOGICAL BOWEL NUTRITION STATUS      Continent Diet (see discharge summary)  AMBULATORY STATUS COMMUNICATION OF NEEDS Skin   Extensive Assist Verbally Normal                       Personal Care Assistance Level of Assistance  Bathing, Feeding, Dressing Bathing Assistance: Maximum assistance Feeding assistance: Limited assistance Dressing Assistance: Maximum assistance     Functional Limitations Info  Sight, Hearing, Speech Sight Info: Adequate Hearing Info: Adequate Speech Info:  Adequate    SPECIAL CARE FACTORS FREQUENCY  PT (By licensed PT), OT (By licensed OT)     PT Frequency: 5x/week OT Frequency: 5x/week            Contractures Contractures Info: Not present    Additional Factors Info  Code Status, Allergies Code Status Info: DNR-LIMITED Allergies Info: Codeine  Metoprolol  Morphine And Codeine  Pantoprazole           Current Medications (03/04/2023):  This is the current hospital active medication list Current Facility-Administered Medications  Medication Dose Route Frequency Provider Last Rate Last Admin   acetaminophen (TYLENOL) tablet 650 mg  650 mg Oral Q6H PRN Opyd, Lavone Neri, MD   650 mg at 03/03/23 1126   Or   acetaminophen (TYLENOL) suppository 650 mg  650 mg Rectal Q6H PRN Opyd, Lavone Neri, MD       aspirin EC tablet 81 mg  81 mg Oral Daily Opyd, Lavone Neri, MD   81 mg at 03/03/23 0903   carvedilol (COREG) tablet 25 mg  25 mg Oral BID WC Opyd, Lavone Neri, MD   25 mg at 03/03/23 1744   diazepam (VALIUM) tablet 2.5-5 mg  2.5-5 mg Oral Q12H PRN Opyd, Lavone Neri, MD   5 mg at 03/03/23 1950   empagliflozin (JARDIANCE) tablet 10 mg  10 mg Oral Daily Arrien, York Ram, MD   10 mg at 03/03/23 9518   furosemide (LASIX) tablet 20 mg  20 mg Oral Daily Arrien, York Ram, MD       influenza vaccine adjuvanted (FLUAD) injection 0.5 mL  0.5 mL Intramuscular Tomorrow-1000 Arrien, York Ram, MD       losartan (COZAAR) tablet 25 mg  25 mg Oral Daily Arrien, York Ram, MD   25 mg at 03/03/23 1217   ondansetron (ZOFRAN) tablet 4 mg  4 mg Oral Q6H PRN Opyd, Lavone Neri, MD       Or   ondansetron (ZOFRAN) injection 4 mg  4 mg Intravenous Q6H PRN Opyd, Lavone Neri, MD       potassium chloride SA (KLOR-CON M) CR tablet 40 mEq  40 mEq Oral Once Arrien, York Ram, MD       pravastatin (PRAVACHOL) tablet 40 mg  40 mg Oral Daily Opyd, Lavone Neri, MD   40 mg at 03/03/23 7829   senna-docusate (Senokot-S) tablet 1 tablet  1 tablet Oral QHS PRN  Briscoe Deutscher, MD   1 tablet at 03/03/23 1950   sodium chloride flush (NS) 0.9 % injection 3 mL  3 mL Intravenous Q12H Opyd, Lavone Neri, MD   3 mL at 03/03/23 2001   spironolactone (ALDACTONE) tablet 12.5 mg  12.5 mg Oral Daily Arrien, York Ram, MD   12.5 mg at 03/03/23 5621     Discharge Medications: Please see discharge summary for a list of discharge medications.  Relevant Imaging Results:  Relevant Lab Results:   Additional Information [id number]  Darianna Amy A [place], LCSWA

## 2023-03-04 NOTE — Assessment & Plan Note (Signed)
Renal function stable with serum cr at 1,8 with K at 4,7 and serum bicarbonate at 31. Na 135 Mg 2.3    Follow up renal function in am, continue with furosemide, spironolactone and SGLT 2 inh.

## 2023-03-05 DIAGNOSIS — E876 Hypokalemia: Secondary | ICD-10-CM | POA: Diagnosis not present

## 2023-03-05 DIAGNOSIS — I160 Hypertensive urgency: Secondary | ICD-10-CM | POA: Diagnosis not present

## 2023-03-05 DIAGNOSIS — I5033 Acute on chronic diastolic (congestive) heart failure: Secondary | ICD-10-CM | POA: Diagnosis not present

## 2023-03-05 DIAGNOSIS — I4821 Permanent atrial fibrillation: Secondary | ICD-10-CM | POA: Diagnosis not present

## 2023-03-05 LAB — BASIC METABOLIC PANEL
Anion gap: 9 (ref 5–15)
BUN: 31 mg/dL — ABNORMAL HIGH (ref 8–23)
CO2: 31 mmol/L (ref 22–32)
Calcium: 8.8 mg/dL — ABNORMAL LOW (ref 8.9–10.3)
Chloride: 95 mmol/L — ABNORMAL LOW (ref 98–111)
Creatinine, Ser: 1.18 mg/dL — ABNORMAL HIGH (ref 0.44–1.00)
GFR, Estimated: 44 mL/min — ABNORMAL LOW (ref >60)
Glucose, Bld: 114 mg/dL — ABNORMAL HIGH (ref 70–99)
Potassium: 4.7 mmol/L (ref 3.5–5.1)
Sodium: 135 mmol/L (ref 135–145)

## 2023-03-05 NOTE — Care Management Important Message (Signed)
Important Message  Patient Details  Name: Rebecca Bradshaw MRN: [id number] Date of Birth: Sep 19, 1930   Important Message Given:  Yes - Medicare IM     Dorena Bodo 03/05/2023, 3:44 PM

## 2023-03-05 NOTE — Progress Notes (Signed)
PT Cancellation Note  Patient Details Name: Rebecca Bradshaw MRN: [id number] DOB: [birth year]   Cancelled Treatment:    Reason Eval/Treat Not Completed: Patient declined, no reason specified;Fatigue/lethargy limiting ability to participate. Patient in recliner, reports feeling very weak and declines PT at this time. Will re-attempt later as time allows.       Laken Lobato 03/05/2023, 11:17 AM

## 2023-03-05 NOTE — Progress Notes (Signed)
Heart Failure Navigator Progress Note  Assessed for Heart & Vascular TOC clinic readiness.  Patient does not meet criteria due to EF 55-60%, No HF TOC per Dr. Ella Jubilee. .   Navigator will sign off at this time.   Rhae Hammock, BSN, Scientist, clinical (histocompatibility and immunogenetics) Only

## 2023-03-05 NOTE — Progress Notes (Signed)
Physical Therapy Treatment Patient Details Name: Rebecca Bradshaw MRN: [id number] DOB: Dec 26, 1930 Today's Date: 03/05/2023   History of Present Illness 89 y.o. female presents to [facility] hospital on 03/01/2023 with progressive dyspnea on exertion and was admitted to the hospital with the working diagnosis of heart failure exacerbation. PMHx: hypertension, hyperlipidemia, and atrial fibrillation    PT Comments  Patient received in recliner. She reports she needs to use the bathroom. She needs min A for sit to stand from recliner and BSC. Min A for side step/pivot steps from recliner> BSC>bed using RW. Patient is shaky and unsteady on feet and is easily fatigued. She will continue to benefit from skilled PT to improve strength, endurance and independence with mobility.       If plan is discharge home, recommend the following: A lot of help with walking and/or transfers;A lot of help with bathing/dressing/bathroom;Assistance with cooking/housework;Assist for transportation;Help with stairs or ramp for entrance   Can travel by private vehicle     No  Equipment Recommendations  Other (comment) (TBD)    Recommendations for Other Services       Precautions / Restrictions Precautions Precautions: Fall Restrictions Weight Bearing Restrictions: No     Mobility  Bed Mobility Overal bed mobility: Modified Independent Bed Mobility: Sit to Supine       Sit to supine: Modified independent (Device/Increase time), Used rails        Transfers Overall transfer level: Needs assistance Equipment used: Rolling walker (2 wheels), 1 person hand held assist Transfers: Sit to/from Stand, Bed to chair/wheelchair/BSC Sit to Stand: Min assist   Step pivot transfers: Min assist       General transfer comment: patient able to stand from recliner, pivot to [facility] then pivot to bed with min A and RW    Ambulation/Gait                   Stairs             Wheelchair Mobility     Tilt  Bed    Modified Rankin (Stroke Patients Only)       Balance Overall balance assessment: Needs assistance Sitting-balance support: Feet supported Sitting balance-Leahy Scale: Good     Standing balance support: Bilateral upper extremity supported, During functional activity, Reliant on assistive device for balance Standing balance-Leahy Scale: Poor Standing balance comment: shaky on legs                            Cognition Arousal: Alert Behavior During Therapy: WFL for tasks assessed/performed Overall Cognitive Status: Within Functional Limits for tasks assessed                                 General Comments: overall WFL for basic orientation and simple functional tasks. accepting that she needs rehab and is unsafe to go home alone        Exercises      General Comments        Pertinent Vitals/Pain Pain Assessment Pain Assessment: No/denies pain    Home Living                          Prior Function            PT Goals (current goals can now be found in the care plan section) Acute Rehab PT Goals  Patient Stated Goal: to improve strength and return to ambulation PT Goal Formulation: With patient Time For Goal Achievement: 03/17/23 Potential to Achieve Goals: Fair Progress towards PT goals: Progressing toward goals    Frequency    Min 1X/week      PT Plan      Co-evaluation              AM-PAC PT "6 Clicks" Mobility   Outcome Measure  Help needed turning from your back to your side while in a flat bed without using bedrails?: A Little Help needed moving from lying on your back to sitting on the side of a flat bed without using bedrails?: A Little Help needed moving to and from a bed to a chair (including a wheelchair)?: A Little Help needed standing up from a chair using your arms (e.g., wheelchair or bedside chair)?: A Little Help needed to walk in hospital room?: A Lot Help needed climbing 3-5 steps  with a railing? : Total 6 Click Score: 15    End of Session Equipment Utilized During Treatment: Gait belt Activity Tolerance: Patient limited by fatigue Patient left: in bed;with call bell/phone within reach;with bed alarm set Nurse Communication: Mobility status PT Visit Diagnosis: Other abnormalities of gait and mobility (R26.89);Muscle weakness (generalized) (M62.81);Difficulty in walking, not elsewhere classified (R26.2);Unsteadiness on feet (R26.81)     Time: 9323-5573 PT Time Calculation (min) (ACUTE ONLY): 15 min  Charges:    $Therapeutic Activity: 8-22 mins PT General Charges $$ ACUTE PT VISIT: 1 Visit                     Blaize Nipper, PT, GCS 03/05/23,1:31 PM

## 2023-03-05 NOTE — Consult Note (Signed)
[facility] Customer service manager [facility]) Accountable Care Organization (ACO) [facility] Liaison Note  03/05/2023  Rebecca Bradshaw 1930/11/17 [id number]  Covering or Reola Calkins, RN ([facility] Liaison)  Location: [facility] Liaison screened the patient remotely at [facility].  Insurance: [employer] Medicare Advantage   Rebecca Bradshaw is a 89 y.o. female who is a Primary Care Patient of Noberto Retort, MD [facility]). The patient was screened for  readmission hospitalization with noted low risk score for unplanned readmission risk with 1 IP in 6 months.  The patient was assessed for potential [facility]) Care Management service needs for post hospital transition for care coordination. Review of patient's electronic medical record reveals patient was admitted for CHF. Pt is with a Radio producer who provides care coordinator services post hospital discharge.    Plan: [facility] Liaison will continue to follow progress and disposition to asess for post hospital community care coordination/management needs.  Referral request for community care coordination: pending disposition.   [facility] Care Management/Population Health does not replace or interfere with any arrangements made by the Inpatient Transition of Care team.   For questions contact:   Elliot Cousin, RN, [facility] Liaison [place]   Population Health Office Hours MTWF  8:00 am-6:00 pm [phone] mobile [phone] [Office toll free line] Office Hours are M-F 8:30 - 5 pm [email or web address]

## 2023-03-05 NOTE — Progress Notes (Signed)
Patient's family requested transition of care to call family about SNF placement. Family expressed to CN that patient is not go to [facility] if discharge. CN expressed understanding.

## 2023-03-05 NOTE — Progress Notes (Signed)
Progress Note   Patient: Rebecca Bradshaw:[id number] DOB: 09-19-1930 DOA: 03/01/2023     4 DOS: the patient was seen and examined on 03/05/2023   Brief hospital course: Mrs. Lineback was admitted to the hospital with the working diagnosis of heart failure exacerbation.   89 yo female with the past medical history of hypertension, hyperlipidemia, and atrial fibrillation who presented with dyspnea and lower extremity edema. She endorsed progressive dyspnea on exertion. She has been hesitant to take diuretics due to polyuria. On 09/19 she was evaluated by cardiology and was found volume overloaded, considering her frailty and ambulatory dysfunction she was referred to the hospital for further diuresis. On her initial physical examination her blood pressure was 198/104, HR 84, RR 23 and 02 saturation 94% on room air, heart with S1 and S2 present, regular rhythm with no gallops or murmurs, lungs with no rhonchi or wheezing, abdomen with no distention and positive lower extremity edema.   Na 140, K 3,9 Cl 100, bicarbonate 29, glucose 99, bun 17 cr 0,82  BNP 169.9 Wbc 3,6 hgb 10,9 plt 103  Urine analysis with SG 1,009, negative for proteins and negative for leukocytes.   Chest radiograph with left rotation, cardiomegaly, bilateral hilar vascular congestion, cephalization of the vasculature and fluid in the right fissure.   EKG 66 bpm, right axis deviation, normal intervals, atrial fibrillation rhythm with no significant ST segment or T wave changes. Poor R R wave progression.   Echocardiogram with preserved LV systolic function.  09/21 volume status has improved, pending PT and OT evaluation.  00/22 patient euvolemic, continue very weak and deconditioned, plan to transfer to SNF for short term rehabilitation.   Assessment and Plan: * Acute on chronic diastolic CHF (congestive heart failure) (HCC) Echocardiogram with preserved LV systolic function with EF 55 to 60%, mild concentric LVH, RV with  low normal systolic function, RVSP 40,3 mmHg, LA and RA with severe dilatation, no significant valvular disease.   Acute on chronic core pulmonale.  Pulmonary hypertension.   Systolic blood pressure 139 to 155 mmHg.  Urine output is 950 ml  Continue with carvedilol 25 mg, spironolactone and SGLT 2 inh.  Losartan and furosemide.   Atrial fibrillation (HCC) Continue rate control with carvedilol.  She has been off anticoagulation due to fall risk and frailty.  Continue telemetry monitoring.    Hypertensive urgency Continue with losartan, carvedilol and spironolactone.   Hypokalemia Renal function stable with serum cr at 1,8 with K at 4,7 and serum bicarbonate at 31. Na 135 Mg 2.3    Follow up renal function in am, continue with furosemide, spironolactone and SGLT 2 inh.   Pancytopenia (HCC) Cell count has been stable.   Anxiety Continue with diazepam per her home regimen.         Subjective: Patient with no chest pain or dyspnea   Physical Exam: Vitals:   03/05/23 0004 03/05/23 0408 03/05/23 0729 03/05/23 0857  BP: 119/61 139/76 (!) 149/93 (!) 155/76  Pulse: 89 81 78   Resp: 18 20 20 15   Temp: 98.5 F (36.9 C) 98.5 F (36.9 C) 98.5 F (36.9 C)   TempSrc: Oral Oral Oral   SpO2: 93% 92%  94%  Weight:  53.5 kg    Height:       Neurology awake and alert ENT with mild pallor  Cardiovascular with S1 and S2 present irregularly irregular with no gallops, rubs or murmurs No JVD Lungs with no wheezing or rales, no rhonchi Abdomen  with no distention  Data Reviewed:    Family Communication: no family at the bedside   Disposition: Status is: Inpatient Remains inpatient appropriate because: pending transfer to SNF  Planned Discharge Destination: Skilled nursing facility     Author: Coralie Keens, MD 03/05/2023 12:13 PM  For on call review [email or web address].

## 2023-03-05 NOTE — Plan of Care (Signed)

## 2023-03-05 NOTE — Progress Notes (Signed)
Mobility Specialist Progress Note:    03/05/23 1357  Mobility  Activity Transferred from bed to chair  Level of Assistance Minimal assist, patient does 75% or more  Assistive Device Front wheel walker  Distance Ambulated (ft) 6 ft  Activity Response Tolerated well  Mobility Referral Yes  $Mobility charge 1 Mobility  Mobility Specialist Start Time (ACUTE ONLY) 1035  Mobility Specialist Stop Time (ACUTE ONLY) 1047  Mobility Specialist Time Calculation (min) (ACUTE ONLY) 12 min   Pt received in bed, agreeable to mobility. Pt needed MinA w/ bed mobility and STS. Was able to take a couple of steps towards the chair w/o fault. All needs et w/ call bell and personal belongings in reach.   Thompson Grayer Mobility Specialist  Please contact vis Secure Chat or  Rehab Office [phone]

## 2023-03-05 NOTE — TOC Progression Note (Addendum)
Transition of Care [facility]) - Progression Note    Patient Details  Name: SOPHIAROSE REMEDIES MRN: [id number] Date of Birth: [birth year]  Transition of Care [facility]) CM/SW Contact  Leander Rams, LCSW Phone Number: 03/05/2023, 12:15 PM  Clinical Narrative:    CSW spoke with pt son Orvilla Fus to deliver SNF bed offers. Orvilla Fus stated he will speak with his sister and then give CSW a call this afternoon.   TOC will continue to follow.   Pt daughter [name] called and stated they will choose [facility] SNF. CSW informed facility.   Expected Discharge Plan: Home w Home Health Services Barriers to Discharge: Continued Medical Work up  Expected Discharge Plan and Services In-house Referral: NA Discharge Planning Services: CM Consult Post Acute Care Choice: NA Living arrangements for the past 2 months: Single Family Home                 DME Arranged: N/A DME Agency: NA       HH Arranged: NA           Social Determinants of Health (SDOH) Interventions SDOH Screenings   Food Insecurity: No Food Insecurity (03/02/2023)  Housing: Low Risk  (03/02/2023)  Transportation Needs: No Transportation Needs (03/02/2023)  Utilities: Not At Risk (03/02/2023)  Social Connections: Unknown (01/26/2022)   Received from [facility]  Tobacco Use: Low Risk  (03/01/2023)    Readmission Risk Interventions     No data to display         Oletta Lamas, MSW, LCSWA, LCASA Transitions of Care  Clinical Social Worker I

## 2023-03-06 DIAGNOSIS — D61818 Other pancytopenia: Secondary | ICD-10-CM | POA: Diagnosis not present

## 2023-03-06 DIAGNOSIS — E876 Hypokalemia: Secondary | ICD-10-CM | POA: Diagnosis not present

## 2023-03-06 DIAGNOSIS — I4821 Permanent atrial fibrillation: Secondary | ICD-10-CM | POA: Diagnosis not present

## 2023-03-06 DIAGNOSIS — I5033 Acute on chronic diastolic (congestive) heart failure: Secondary | ICD-10-CM | POA: Diagnosis not present

## 2023-03-06 LAB — RENAL FUNCTION PANEL
Albumin: 3.4 g/dL — ABNORMAL LOW (ref 3.5–5.0)
Anion gap: 8 (ref 5–15)
BUN: 27 mg/dL — ABNORMAL HIGH (ref 8–23)
CO2: 29 mmol/L (ref 22–32)
Calcium: 8.9 mg/dL (ref 8.9–10.3)
Chloride: 98 mmol/L (ref 98–111)
Creatinine, Ser: 0.95 mg/dL (ref 0.44–1.00)
GFR, Estimated: 57 mL/min — ABNORMAL LOW (ref >60)
Glucose, Bld: 112 mg/dL — ABNORMAL HIGH (ref 70–99)
Phosphorus: 4.1 mg/dL (ref 2.5–4.6)
Potassium: 4.7 mmol/L (ref 3.5–5.1)
Sodium: 135 mmol/L (ref 135–145)

## 2023-03-06 MED ORDER — POLYETHYLENE GLYCOL 3350 17 G PO PACK: 17.0000 g | PACK | Freq: Every day | ORAL | 0 refills | Status: AC

## 2023-03-06 MED ORDER — LOSARTAN POTASSIUM 25 MG PO TABS: 25.0000 mg | ORAL_TABLET | Freq: Every day | ORAL | 0 refills | Status: AC

## 2023-03-06 MED ORDER — POLYETHYLENE GLYCOL 3350 17 G PO PACK
17.0000 g | PACK | Freq: Every day | ORAL | Status: DC
  Administered 2023-03-06 – 2023-03-07 (×2): 17 g via ORAL
  Filled 2023-03-06 (×2): qty 1

## 2023-03-06 MED ORDER — EMPAGLIFLOZIN 10 MG PO TABS: 10.0000 mg | ORAL_TABLET | Freq: Every day | ORAL | 0 refills | Status: AC

## 2023-03-06 MED ORDER — SPIRONOLACTONE 25 MG PO TABS: 12.5000 mg | ORAL_TABLET | Freq: Every day | ORAL | 0 refills | Status: AC

## 2023-03-06 MED ORDER — FUROSEMIDE 20 MG PO TABS: 20.0000 mg | ORAL_TABLET | Freq: Every day | ORAL | 0 refills | Status: AC

## 2023-03-06 NOTE — Plan of Care (Signed)
  Problem: Activity: Goal: Capacity to carry out activities will improve Outcome: Progressing   Problem: Clinical Measurements: Goal: Respiratory complications will improve Outcome: Progressing   Problem: Nutrition: Goal: Adequate nutrition will be maintained Outcome: Progressing   Problem: Health Behavior/Discharge Planning: Goal: Ability to manage health-related needs will improve Outcome: Not Progressing   Problem: Activity: Goal: Risk for activity intolerance will decrease Outcome: Not Progressing   Problem: Skin Integrity: Goal: Risk for impaired skin integrity will decrease Outcome: Not Progressing

## 2023-03-06 NOTE — Progress Notes (Signed)
Mobility Specialist Progress Note:    03/06/23 1204  Mobility  Activity Transferred from bed to chair  Level of Assistance Minimal assist, patient does 75% or more  Assistive Device Front wheel walker  Distance Ambulated (ft) 6 ft  Activity Response Tolerated well  Mobility Referral Yes  $Mobility charge 1 Mobility  Mobility Specialist Start Time (ACUTE ONLY) 1125  Mobility Specialist Stop Time (ACUTE ONLY) 1140  Mobility Specialist Time Calculation (min) (ACUTE ONLY) 15 min    Pt received in bed, agreeable to mobility. Pt needed MinA w/ bed mobility and STS. Was able to take a couple of steps towards the chair w/o fault. Situated in chair w/ call bell and personal belongings in reach. All needs met.  Thompson Grayer Mobility Specialist  Please contact vis Secure Chat or  Rehab Office [phone]

## 2023-03-06 NOTE — Discharge Summary (Signed)
Physician Discharge Summary   Patient: Rebecca Bradshaw MRN: [id number] DOB: 1930/11/27  Admit date:     03/01/2023  Discharge date: 03/06/23  Discharge Physician: York Ram Amaura Authier   PCP: Noberto Retort, MD   Recommendations at discharge:    Heart failure guideline directive medical therapy with empagliflozin, spironolactone, losartan and carvedilol. Loop diuretic with furosemide  Follow up renal function and electrolytes in 7 days as outpatient, Follow up with Dr Tiburcio Pea in 7 to 10 days.   Discharge Diagnoses: Principal Problem:   Acute on chronic diastolic CHF (congestive heart failure) (HCC) Active Problems:   Atrial fibrillation (HCC)   Hypertensive urgency   Hypokalemia   Pancytopenia (HCC)   Anxiety  Resolved Problems:   * No resolved hospital problems. [facility] Course: Rebecca Bradshaw was admitted to the hospital with the working diagnosis of heart failure exacerbation.   89 yo female with the past medical history of hypertension, hyperlipidemia, and atrial fibrillation who presented with dyspnea and lower extremity edema. She endorsed progressive dyspnea on exertion. She has been hesitant to take diuretics due to polyuria. On 09/19 she was evaluated by cardiology and was found volume overloaded, considering her frailty and ambulatory dysfunction she was referred to the hospital for further diuresis. On her initial physical examination her blood pressure was 198/104, HR 84, RR 23 and 02 saturation 94% on room air, heart with S1 and S2 present, regular rhythm with no gallops or murmurs, lungs with no rhonchi or wheezing, abdomen with no distention and positive lower extremity edema.   Na 140, K 3,9 Cl 100, bicarbonate 29, glucose 99, bun 17 cr 0,82  BNP 169.9 Wbc 3,6 hgb 10,9 plt 103  Urine analysis with SG 1,009, negative for proteins and negative for leukocytes.   Chest radiograph with left rotation, cardiomegaly, bilateral hilar vascular congestion,  cephalization of the vasculature and fluid in the right fissure.   EKG 66 bpm, right axis deviation, normal intervals, atrial fibrillation rhythm with no significant ST segment or T wave changes. Poor R R wave progression.   Echocardiogram with preserved LV systolic function.  09/21 volume status has improved, pending PT and OT evaluation.  00/22 patient euvolemic, continue very weak and deconditioned, plan to transfer to SNF for short term rehabilitation.  09/23 patient is medically stable to be transfer to SNF.   Assessment and Plan: * Acute on chronic diastolic CHF (congestive heart failure) (HCC) Echocardiogram with preserved LV systolic function with EF 55 to 60%, mild concentric LVH, RV with low normal systolic function, RVSP 40,3 mmHg, LA and RA with severe dilatation, no significant valvular disease.   Acute on chronic core pulmonale.  Pulmonary hypertension.   Systolic blood pressure 147 to 172 mmHg.  Clinically euvolemic.   Continue with carvedilol 25 mg, spironolactone and SGLT 2 inh.  Losartan and furosemide.  Will allow high systolic blood pressure, up to 180 mmHg, she has significant frailty and has risk of complications from a tight blood pressure control.   Atrial fibrillation (HCC) Continue rate control with carvedilol.  She has been off anticoagulation due to fall risk and frailty.  Continue telemetry monitoring.    Hypertensive urgency Continue with losartan, carvedilol and spironolactone.   Hypokalemia Electrolytes have been corrected, discharge K is 4,7 with serum bicarbonate at 29 and Na 135 Cr is 0,95.     Plan to continue diuretic therapy with furosemide, spironolactone and SGLT 2 inh.  Pancytopenia (HCC) Cell count has been stable.  Anxiety Continue with diazepam per her home regimen.          Consultants: none  Procedures performed: none   Disposition: Skilled nursing facility Diet recommendation:  Cardiac diet DISCHARGE  MEDICATION: Allergies as of 03/06/2023       Reactions   Codeine Nausea And Vomiting   Metoprolol Other (See Comments)   "Nightmares"   Morphine And Codeine Nausea And Vomiting   Pantoprazole Other (See Comments)   nightmares        Medication List     TAKE these medications    acetaminophen 325 MG tablet Commonly known as: TYLENOL Take 650 mg by mouth daily as needed for mild pain.   aspirin 81 MG tablet Take 1 tablet (81 mg total) by mouth daily.   calcium-vitamin D 500-200 MG-UNIT tablet Commonly known as: OSCAL WITH D Take 1 tablet by mouth daily.   carvedilol 25 MG tablet Commonly known as: COREG TAKE 1 TABLET BY MOUTH TWICE DAILY WITH A MEAL   diazepam 5 MG tablet Commonly known as: VALIUM Take 2.5 mg by mouth 2 (two) times daily. Take 0.5 tablet (2.5 mg total) by mouth every morning and take 1 tablet (5 mg total) by mouth every evening.   empagliflozin 10 MG Tabs tablet Commonly known as: JARDIANCE Take 1 tablet (10 mg total) by mouth daily. Start taking on: March 07, 2023   furosemide 20 MG tablet Commonly known as: LASIX Take 1 tablet (20 mg total) by mouth daily. Start taking on: March 07, 2023   losartan 25 MG tablet Commonly known as: COZAAR Take 1 tablet (25 mg total) by mouth daily. Start taking on: March 07, 2023   meclizine 25 MG tablet Commonly known as: ANTIVERT Take 25 mg by mouth 3 (three) times daily as needed for dizziness.   ofloxacin 0.3 % OTIC solution Commonly known as: FLOXIN Place 5 drops in ear(s) 2 (two) times daily.   polyethylene glycol 17 g packet Commonly known as: MIRALAX / GLYCOLAX Take 17 g by mouth daily. Start taking on: March 07, 2023   pravastatin 40 MG tablet Commonly known as: PRAVACHOL Take 1 tablet (40 mg total) by mouth daily.   PRESERVISION AREDS PO Take by mouth.   REFRESH OP Place 2 drops into both eyes 2 (two) times daily as needed (DRY EYES).   spironolactone 25 MG  tablet Commonly known as: ALDACTONE Take 0.5 tablets (12.5 mg total) by mouth daily. Start taking on: March 07, 2023   VITAMIN D PO Take 1 Units by mouth daily.        Contact information for after-discharge care     Destination     [facility] Preferred SNF .   Service: Skilled Nursing Contact information: [address] [place] [address] [phone]                    Discharge Exam: Filed Weights   03/04/23 0444 03/05/23 0408 03/06/23 0513  Weight: 49.1 kg 53.5 kg 50.5 kg   BP (!) 103/57 (BP Location: Right Arm)   Pulse 66   Temp 98.5 F (36.9 C) (Oral)   Resp 18   Ht 5\' 3"  (1.6 m)   Wt 50.5 kg   SpO2 96%   BMI 19.72 kg/m   Patient with no chest pain or dyspnea, no lower extremity edema. Today with constipation   Neurology awake and alert ENT with mild pallor Cardiovascular with S1 and S2 present, irregularly  irregular with no gallops, rubs or murmurs No JVD No lower extremity edema Respiratory with no rales or wheezing, no rhonchi Abdomen with no distention   Condition at discharge: stable  The results of significant diagnostics from this hospitalization (including imaging, microbiology, ancillary and laboratory) are listed below for reference.   Imaging Studies: ECHOCARDIOGRAM COMPLETE  Result Date: 03/02/2023    ECHOCARDIOGRAM REPORT   Patient Name:   Rebecca Bradshaw Date of Exam: 03/02/2023 Medical Rec #:  [id number]        Height:       63.0 in Accession #:    [id number]       Weight:       115.7 lb Date of Birth:  12-Oct-1930        BSA:          1.533 m Patient Age:    89 years         BP:           193/96 mmHg Patient Gender: F                HR:           74 bpm. Exam Location:  Inpatient Procedure: 2D Echo, Color Doppler and Cardiac Doppler Indications:    acute diastolic chf  History:        Patient has prior history of Echocardiogram examinations, most                 recent 12/10/2007. CHF,  Arrythmias:Atrial Fibrillation; Risk                 Factors:Hypertension and Dyslipidemia.  Sonographer:    Delcie Roch RDCS Referring Phys: [id number] TIMOTHY S OPYD IMPRESSIONS  1. Technically difficult study with poor visualization of cardiac structures.  2. Left ventricular ejection fraction, by estimation, is 55 to 60%. The left ventricle has normal function. The left ventricle has no regional wall motion abnormalities. There is mild concentric left ventricular hypertrophy. Left ventricular diastolic function could not be evaluated. Elevated left atrial pressure.  3. Right ventricular systolic function is low normal. The right ventricular size is normal. There is mildly elevated pulmonary artery systolic pressure. The estimated right ventricular systolic pressure is 40.3 mmHg.  4. Left atrial size was severely dilated.  5. Right atrial size was severely dilated.  6. The mitral valve is normal in structure. Trivial mitral valve regurgitation.  7. The aortic valve is normal in structure. There is mild calcification of the aortic valve. Aortic valve regurgitation is not visualized.  8. The inferior vena cava is dilated in size with >50% respiratory variability, suggesting right atrial pressure of 8 mmHg. FINDINGS  Left Ventricle: Left ventricular ejection fraction, by estimation, is 55 to 60%. The left ventricle has normal function. The left ventricle has no regional wall motion abnormalities. The left ventricular internal cavity size was normal in size. There is  mild concentric left ventricular hypertrophy. Left ventricular diastolic function could not be evaluated due to nondiagnostic images. Left ventricular diastolic function could not be evaluated. Elevated left atrial pressure. Right Ventricle: The right ventricular size is normal. No increase in right ventricular wall thickness. Right ventricular systolic function is low normal. There is mildly elevated pulmonary artery systolic pressure. The tricuspid  regurgitant velocity is 2.97 m/s, and with an assumed right atrial pressure of 5 mmHg, the estimated right ventricular systolic pressure is 40.3 mmHg. Left Atrium: Left atrial size was severely dilated. Right Atrium: Right atrial size  was severely dilated. Pericardium: There is no evidence of pericardial effusion. Mitral Valve: The mitral valve is normal in structure. Mild mitral annular calcification. Trivial mitral valve regurgitation. Tricuspid Valve: The tricuspid valve is grossly normal. Tricuspid valve regurgitation is trivial. Aortic Valve: The aortic valve is normal in structure. There is mild calcification of the aortic valve. Aortic valve regurgitation is not visualized. Pulmonic Valve: The pulmonic valve was normal in structure. Pulmonic valve regurgitation is not visualized. Aorta: The aortic root was not well visualized and the aortic root is normal in size and structure. Venous: The inferior vena cava is dilated in size with greater than 50% respiratory variability, suggesting right atrial pressure of 8 mmHg. IAS/Shunts: No atrial level shunt detected by color flow Doppler.  LEFT VENTRICLE PLAX 2D LVOT diam:     1.80 cm LV SV:         27 LV SV Index:   18 LVOT Area:     2.54 cm  RIGHT VENTRICLE             IVC RV Basal diam:  3.00 cm     IVC diam: 2.30 cm RV S prime:     10.60 cm/s TAPSE (M-mode): 1.4 cm LEFT ATRIUM              Index        RIGHT ATRIUM           Index LA Vol (A2C):   107.0 ml 69.82 ml/m  RA Area:     23.00 cm LA Vol (A4C):   88.7 ml  57.88 ml/m  RA Volume:   69.60 ml  45.41 ml/m LA Biplane Vol: 99.0 ml  64.60 ml/m  AORTIC VALVE LVOT Vmax:   69.70 cm/s LVOT Vmean:  44.700 cm/s LVOT VTI:    0.106 m  AORTA Ao Root diam: 2.80 cm Ao Asc diam:  3.70 cm TRICUSPID VALVE TR Peak grad:   35.3 mmHg TR Vmax:        297.00 cm/s  SHUNTS Systemic VTI:  0.11 m Systemic Diam: 1.80 cm Aditya Sabharwal Electronically signed by Dorthula Nettles Signature Date/Time: 03/02/2023/2:09:28 PM    Final     DG Abd Portable 1V  Result Date: 03/01/2023 CLINICAL DATA:  Lower abdominal pain EXAM: PORTABLE ABDOMEN - 1 VIEW COMPARISON:  12/31/2015 FINDINGS: Scattered large and small bowel gas is noted. Mild retained fecal material is noted. No obstructive changes are seen. No free air is noted. Degenerative changes of the lumbar spine are noted. IMPRESSION: No acute abnormality seen. Electronically Signed   By: Alcide Clever M.D.   On: 03/01/2023 21:25   DG Chest Port 1 View  Result Date: 03/01/2023 CLINICAL DATA:  Shortness of breath EXAM: PORTABLE CHEST 1 VIEW COMPARISON:  Abdominal series with chest x-ray 12/31/2015 FINDINGS: The heart is enlarged. The left costophrenic angle has been excluded from the exam. The lungs otherwise appear clear. There is no pneumothorax or acute fracture. IMPRESSION: Cardiomegaly. No acute cardiopulmonary process. Electronically Signed   By: Darliss Cheney M.D.   On: 03/01/2023 19:17    Microbiology: Results for orders placed or performed in visit on 06/26/13  Urine Culture     Status: None   Collection Time: 06/26/13  4:33 PM   Specimen: Urine  Result Value Ref Range Status   Colony Count NO GROWTH  Final   Organism ID, Bacteria NO GROWTH  Final    Labs: CBC: Recent Labs  Lab 03/01/23 1721 03/02/23 0334  WBC 3.6* 3.6*  NEUTROABS 1.7  --   HGB 10.9* 10.7*  HCT 35.0* 33.8*  MCV 88.8 87.6  PLT 103* 105*   Basic Metabolic Panel: Recent Labs  Lab 03/02/23 0334 03/03/23 0301 03/04/23 0237 03/05/23 0301 03/06/23 0245  NA 137 136 135 135 135  K 3.2* 4.0 3.8 4.7 4.7  CL 97* 95* 92* 95* 98  CO2 31 32 32 31 29  GLUCOSE 92 112* 110* 114* 112*  BUN 14 16 24* 31* 27*  CREATININE 0.74 1.01* 0.99 1.18* 0.95  CALCIUM 8.6* 9.2 9.3 8.8* 8.9  MG 1.9 2.1 2.3  --   --   PHOS  --   --   --   --  4.1   Liver Function Tests: Recent Labs  Lab 03/01/23 1721 03/06/23 0245  AST 24  --   ALT 19  --   ALKPHOS 61  --   BILITOT 0.9  --   PROT 6.4*  --   ALBUMIN  3.8 3.4*   CBG: No results for input(s): "GLUCAP" in the last 168 hours.  Discharge time spent: greater than 30 minutes.  Signed: Coralie Keens, MD [facility] 03/06/2023

## 2023-03-06 NOTE — Progress Notes (Addendum)
Progress Note   Patient: Rebecca Bradshaw WGN:[id number] DOB: [birth year] DOA: 03/01/2023     5 DOS: the patient was seen and examined on 03/06/2023   Brief hospital course: Rebecca Bradshaw was admitted to the hospital with the working diagnosis of heart failure exacerbation.   89 yo female with the past medical history of hypertension, hyperlipidemia, and atrial fibrillation who presented with dyspnea and lower extremity edema. She endorsed progressive dyspnea on exertion. She has been hesitant to take diuretics due to polyuria. On 09/19 she was evaluated by cardiology and was found volume overloaded, considering her frailty and ambulatory dysfunction she was referred to the hospital for further diuresis. On her initial physical examination her blood pressure was 198/104, HR 84, RR 23 and 02 saturation 94% on room air, heart with S1 and S2 present, regular rhythm with no gallops or murmurs, lungs with no rhonchi or wheezing, abdomen with no distention and positive lower extremity edema.   Na 140, K 3,9 Cl 100, bicarbonate 29, glucose 99, bun 17 cr 0,82  BNP 169.9 Wbc 3,6 hgb 10,9 plt 103  Urine analysis with SG 1,009, negative for proteins and negative for leukocytes.   Chest radiograph with left rotation, cardiomegaly, bilateral hilar vascular congestion, cephalization of the vasculature and fluid in the right fissure.   EKG 66 bpm, right axis deviation, normal intervals, atrial fibrillation rhythm with no significant ST segment or T wave changes. Poor R R wave progression.   Echocardiogram with preserved LV systolic function.  09/21 volume status has improved, pending PT and OT evaluation.  00/22 patient euvolemic, continue very weak and deconditioned, plan to transfer to SNF for short term rehabilitation.  09/23 patient is medically stable to be transfer to SNF.    Assessment and Plan: * Acute on chronic diastolic CHF (congestive heart failure) (HCC) Echocardiogram with preserved LV  systolic function with EF 55 to 60%, mild concentric LVH, RV with low normal systolic function, RVSP 40,3 mmHg, LA and RA with severe dilatation, no significant valvular disease.   Acute on chronic core pulmonale.  Pulmonary hypertension.   Systolic blood pressure 147 to 172 mmHg.  Clinically euvolemic.   Continue with carvedilol 25 mg, spironolactone and SGLT 2 inh.  Losartan and furosemide.  Will allow high systolic blood pressure, up to 180 mmHg, she has significant frailty and has risk of complications from a tight blood pressure control.   Atrial fibrillation (HCC) Continue rate control with carvedilol.  She has been off anticoagulation due to fall risk and frailty.  Continue telemetry monitoring.    Hypertensive urgency Continue with losartan, carvedilol and spironolactone.   Hypokalemia Electrolytes have been corrected, today K is 4,7 with serum bicarbonate at 29 and Na 135 Cr is 0,95.     Plan to continue diuretic therapy with furosemide, spironolactone and SGLT 2 inh.  Plan to check renal function in 72 hrs if patient still in the hospital by then.  Pancytopenia (HCC) Cell count has been stable.   Anxiety Continue with diazepam per her home regimen.       Subjective: Patient with no chest pain or dyspnea, no lower extremity edema. Today with constipation   Physical Exam: Vitals:   03/05/23 1925 03/06/23 0011 03/06/23 0513 03/06/23 0754  BP: (!) 109/59 (!) 140/85 (!) 147/90 (!) 172/88  Pulse:  81 81 86  Resp: 18 (!) 22 17 20   Temp: 98 F (36.7 C) 97.7 F (36.5 C) 97.7 F (36.5 C) (P) 97.7 F (36.5 C)  TempSrc: Oral Oral Oral (P) Oral  SpO2: 93% 93% 94% 93%  Weight:   50.5 kg   Height:       Neurology awake and alert ENT with mild pallor Cardiovascular with S1 and S2 present, irregularly irregular with no gallops, rubs or murmurs No JVD No lower extremity edema Respiratory with no rales or wheezing, no rhonchi Abdomen with no distention  Data  Reviewed:    Family Communication: no family at the bedside. I spoke with patient's son and daughter over the phone, we talked in detail about patient's condition, plan of care and prognosis and all questions were addressed.   Disposition: Status is: Inpatient Remains inpatient appropriate because: pending transfer to SNF   Planned Discharge Destination: Skilled nursing facility    Author: Coralie Keens, MD 03/06/2023 1:51 PM  For on call review [email or web address].

## 2023-03-06 NOTE — Progress Notes (Signed)
Occupational Therapy Treatment Patient Details Name: Rebecca Bradshaw MRN: [id number] DOB: 07/21/1930 Today's Date: 03/06/2023   History of present illness 89 y.o. female presents to [facility] on 03/01/2023 with progressive dyspnea on exertion and was admitted to the hospital with the working diagnosis of heart failure exacerbation. PMHx: hypertension, hyperlipidemia, and atrial fibrillation   OT comments  Patient up in recliner, requesting use of bedside commode and return to bed.  Patient with c/o sore back and bottom.  Patient overall needing Min A for basic transfers, and closer to Mod A for hygiene post 3n1 use.  Patient declined seated grooming, and provided Min A for back to bed.  OT can continue efforts in the acute setting and Patient will benefit from continued inpatient follow up therapy, <3 hours/day       If plan is discharge home, recommend the following:  Assist for transportation   Equipment Recommendations  None recommended by OT    Recommendations for Other Services      Precautions / Restrictions Precautions Precautions: Fall Restrictions Weight Bearing Restrictions: No       Mobility Bed Mobility Overal bed mobility: Needs Assistance Bed Mobility: Sit to Supine       Sit to supine: Min assist        Transfers Overall transfer level: Needs assistance Equipment used: Rolling walker (2 wheels) Transfers: Sit to/from Stand, Bed to chair/wheelchair/BSC Sit to Stand: Min assist     Step pivot transfers: Min assist           Balance Overall balance assessment: Needs assistance Sitting-balance support: Feet supported Sitting balance-Leahy Scale: Good     Standing balance support: Reliant on assistive device for balance Standing balance-Leahy Scale: Poor                             ADL either performed or assessed with clinical judgement   ADL                           Toilet Transfer: Maximal  assistance;Stand-pivot;BSC/3in1   Toileting- Clothing Manipulation and Hygiene: Maximal assistance;Sit to/from stand              Extremity/Trunk Assessment Upper Extremity Assessment Upper Extremity Assessment: Generalized weakness   Lower Extremity Assessment Lower Extremity Assessment: Defer to PT evaluation   Cervical / Trunk Assessment Cervical / Trunk Assessment: Kyphotic    Vision Baseline Vision/History: 0 No visual deficits Vision Assessment?: No apparent visual deficits   Perception Perception Perception: Not tested   Praxis Praxis Praxis: Not tested    Cognition Arousal: Alert Behavior During Therapy: WFL for tasks assessed/performed Overall Cognitive Status: Within Functional Limits for tasks assessed                                          Exercises      Shoulder Instructions       General Comments      Pertinent Vitals/ Pain       Pain Assessment Pain Assessment: Faces Faces Pain Scale: Hurts a little bit Pain Location: back Pain Descriptors / Indicators: Sore Pain Intervention(s): Monitored during session  Frequency  Min 1X/week        Progress Toward Goals  OT Goals(current goals can now be found in the care plan section)  Progress towards OT goals: Progressing toward goals  Acute Rehab OT Goals OT Goal Formulation: With patient Potential to Achieve Goals: Good  Plan      Co-evaluation                 AM-PAC OT "6 Clicks" Daily Activity     Outcome Measure   Help from another person eating meals?: None Help from another person taking care of personal grooming?: A Little Help from another person toileting, which includes using toliet, bedpan, or urinal?: A Lot Help from another person bathing (including washing, rinsing, drying)?: A Lot Help from another person to put on and taking off regular upper body clothing?: A  Little Help from another person to put on and taking off regular lower body clothing?: A Lot 6 Click Score: 16    End of Session Equipment Utilized During Treatment: Rolling walker (2 wheels)  OT Visit Diagnosis: Unsteadiness on feet (R26.81);Other abnormalities of gait and mobility (R26.89);Muscle weakness (generalized) (M62.81)   Activity Tolerance Patient tolerated treatment well   Patient Left in bed;with call bell/phone within reach;with family/visitor present   Nurse Communication Mobility status        Time: 1652-1710 OT Time Calculation (min): 18 min  Charges: OT General Charges $OT Visit: 1 Visit OT Treatments $Self Care/Home Management : 8-22 mins  03/06/2023  RP, OTR/L  Acute Rehabilitation Services  Office:  [phone]   Suzanna Obey 03/06/2023, 5:39 PM

## 2023-03-06 NOTE — Plan of Care (Signed)
  Problem: Education: Goal: Ability to verbalize understanding of medication therapies will improve Outcome: Progressing   Problem: Activity: Goal: Capacity to carry out activities will improve Outcome: Progressing   Problem: Activity: Goal: Risk for activity intolerance will decrease Outcome: Progressing   Problem: Nutrition: Goal: Adequate nutrition will be maintained Outcome: Progressing   Problem: Coping: Goal: Level of anxiety will decrease Outcome: Progressing   Problem: Pain Managment: Goal: General experience of comfort will improve Outcome: Progressing   Problem: Safety: Goal: Ability to remain free from injury will improve Outcome: Progressing   Problem: Health Behavior/Discharge Planning: Goal: Ability to manage health-related needs will improve Outcome: Not Progressing   Problem: Skin Integrity: Goal: Risk for impaired skin integrity will decrease Outcome: Not Progressing

## 2023-03-06 NOTE — TOC Progression Note (Signed)
Transition of Care [facility]) - Progression Note    Patient Details  Name: Rebecca Bradshaw MRN: [id number] Date of Birth: 10/25/1930  [facility]) CM/SW Contact  Leander Rams, LCSW Phone Number: 03/06/2023, 3:48 PM  Clinical Narrative:    CSW called pt daughter [name] to discuss disposition. CSW explained that [facility] has also tried to start insurance auth on pt and pt is out of network. Daughter requested [facility]. CSW spoke with French Ana at [place], regarding pt potential dc to facility. [facility] is not able to offer a SNF bed. CSW called [name] back to provide update.   CSW explained that pt is medically ready for dc and pt also has insurance auth approval to go to [facility], which is the first facility Robin requested pt go to. CSW offered the option of pt to dc home with [facility] services if they aren't satisfied with SNF offers. Daughter told CSW that pt will not leave the hospital until she chooses another facility for pt to go to and hung up the phone on CSW.   TOC will continue to follow.    Expected Discharge Plan: Home w Home Health Services Barriers to Discharge: Continued Medical Work up  Expected Discharge Plan and Services In-house Referral: NA Discharge Planning Services: CM Consult Post Acute Care Choice: NA Living arrangements for the past 2 months: Single Family Home                 DME Arranged: N/A DME Agency: NA       HH Arranged: NA           Social Determinants of Health (SDOH) Interventions SDOH Screenings   Food Insecurity: No Food Insecurity (03/02/2023)  Housing: Low Risk  (03/02/2023)  Transportation Needs: No Transportation Needs (03/02/2023)  Utilities: Not At Risk (03/02/2023)  Social Connections: Unknown (01/26/2022)   Received from [facility], [facility]  Tobacco Use: Low Risk  (03/01/2023)    Readmission Risk Interventions     No data to display

## 2023-03-06 NOTE — Progress Notes (Incomplete)
PROGRESS NOTE    Rebecca Bradshaw  ZOX:[id number] DOB: 08-14-1930 DOA: 03/01/2023 PCP: Noberto Retort, MD   89/F w/ hypertension, hyperlipidemia, and atrial fibrillation who presented with dyspnea and lower extremity edema. -On 09/19 she was evaluated by cardiology and was found volume overloaded, referred to the hospital for further diuresis. In ED, hypertensive w/ positive lower extremity edema. cr 0,82 , BNP 169.9 CXR w cardiomegaly, bilateral hilar vascular congestion, cephalization of the vasculature and fluid in the right fissure.  Echocardiogram with preserved LV systolic function. -9/21 volume status has improved, pending PT and OT evaluation.  -9/22 patient euvolemic, continue very weak and deconditioned, plan to transfer to SNF for short term rehabilitation.  9/23 patient is medically stable to be transfer to SNF.   Subjective:   Assessment and Plan:  Acute on chronic diastolic CHF Pulm Hypertension  -Echo  with EF 55 to 60%, RV with low normal systolic function, -continue carvedilol 25 mg, spironolactone and SGLT 2 inh.  Losartan and furosemide.  Will allow high systolic blood pressure, up to 180 mmHg, she has significant frailty and has risk of complications from a tight blood pressure control.   Atrial fibrillation (HCC) Continue rate control with carvedilol.  She has been off anticoagulation due to fall risk and frailty.   Hypertensive urgency Continue with losartan, carvedilol and spironolactone.   Hypokalemia repleted  Pancytopenia (HCC) Cell count has been stable.   Anxiety Continue with diazepam per her home regimen.         DVT prophylaxis: SCDS Code Status: DBR Family Communication: Disposition Plan:   Consultants:    Procedures:   Antimicrobials:    Objective: Vitals:   03/05/23 1925 03/06/23 0011 03/06/23 0513 03/06/23 0754  BP: (!) 109/59 (!) 140/85 (!) 147/90 (!) 172/88  Pulse:  81 81 86  Resp: 18 (!) 22 17 20   Temp: 98 F  (36.7 C) 97.7 F (36.5 C) 97.7 F (36.5 C) (P) 97.7 F (36.5 C)  TempSrc: Oral Oral Oral (P) Oral  SpO2: 93% 93% 94% 93%  Weight:   50.5 kg   Height:        Intake/Output Summary (Last 24 hours) at 03/06/2023 1405 Last data filed at 03/06/2023 0514 Gross per 24 hour  Intake 240 ml  Output 450 ml  Net -210 ml   Filed Weights   03/04/23 0444 03/05/23 0408 03/06/23 0513  Weight: 49.1 kg 53.5 kg 50.5 kg    Examination:  General exam: Appears calm and comfortable  Respiratory system: Clear to auscultation Cardiovascular system: S1 & S2 heard, RRR.  Abd: nondistended, soft and nontender.Normal bowel sounds heard. Central nervous system: Alert and oriented. No focal neurological deficits. Extremities: no edema Skin: No rashes Psychiatry:  Mood & affect appropriate.     Data Reviewed:   CBC: Recent Labs  Lab 03/01/23 1721 03/02/23 0334  WBC 3.6* 3.6*  NEUTROABS 1.7  --   HGB 10.9* 10.7*  HCT 35.0* 33.8*  MCV 88.8 87.6  PLT 103* 105*   Basic Metabolic Panel: Recent Labs  Lab 03/02/23 0334 03/03/23 0301 03/04/23 0237 03/05/23 0301 03/06/23 0245  NA 137 136 135 135 135  K 3.2* 4.0 3.8 4.7 4.7  CL 97* 95* 92* 95* 98  CO2 31 32 32 31 29  GLUCOSE 92 112* 110* 114* 112*  BUN 14 16 24* 31* 27*  CREATININE 0.74 1.01* 0.99 1.18* 0.95  CALCIUM 8.6* 9.2 9.3 8.8* 8.9  MG 1.9 2.1 2.3  --   --  PHOS  --   --   --   --  4.1   GFR: Estimated Creatinine Clearance: 31.4 mL/min (by C-G formula based on SCr of 0.95 mg/dL). Liver Function Tests: Recent Labs  Lab 03/01/23 1721 03/06/23 0245  AST 24  --   ALT 19  --   ALKPHOS 61  --   BILITOT 0.9  --   PROT 6.4*  --   ALBUMIN 3.8 3.4*   No results for input(s): "LIPASE", "AMYLASE" in the last 168 hours. No results for input(s): "AMMONIA" in the last 168 hours. Coagulation Profile: Recent Labs  Lab 03/02/23 0334  INR 1.2   Cardiac Enzymes: No results for input(s): "CKTOTAL", "CKMB", "CKMBINDEX", "TROPONINI"  in the last 168 hours. BNP (last 3 results) No results for input(s): "PROBNP" in the last 8760 hours. HbA1C: No results for input(s): "HGBA1C" in the last 72 hours. CBG: No results for input(s): "GLUCAP" in the last 168 hours. Lipid Profile: No results for input(s): "CHOL", "HDL", "LDLCALC", "TRIG", "CHOLHDL", "LDLDIRECT" in the last 72 hours. Thyroid Function Tests: No results for input(s): "TSH", "T4TOTAL", "FREET4", "T3FREE", "THYROIDAB" in the last 72 hours. Anemia Panel: No results for input(s): "VITAMINB12", "FOLATE", "FERRITIN", "TIBC", "IRON", "RETICCTPCT" in the last 72 hours. Urine analysis:    Component Value Date/Time   COLORURINE YELLOW 03/01/2023 1717   APPEARANCEUR CLEAR 03/01/2023 1717   LABSPEC 1.009 03/01/2023 1717   PHURINE 7.0 03/01/2023 1717   GLUCOSEU NEGATIVE 03/01/2023 1717   HGBUR NEGATIVE 03/01/2023 1717   BILIRUBINUR NEGATIVE 03/01/2023 1717   KETONESUR NEGATIVE 03/01/2023 1717   PROTEINUR NEGATIVE 03/01/2023 1717   UROBILINOGEN 0.2 04/07/2014 1104   NITRITE NEGATIVE 03/01/2023 1717   LEUKOCYTESUR NEGATIVE 03/01/2023 1717   Sepsis Labs: @LABRCNTIP (procalcitonin:4,lacticidven:4)  )No results found for this or any previous visit (from the past 240 hour(s)).   Radiology Studies: No results found.   Scheduled Meds:  aspirin EC  81 mg Oral Daily   carvedilol  25 mg Oral BID WC   empagliflozin  10 mg Oral Daily   furosemide  20 mg Oral Daily   losartan  25 mg Oral Daily   polyethylene glycol  17 g Oral Daily   pravastatin  40 mg Oral Daily   sodium chloride flush  3 mL Intravenous Q12H   spironolactone  12.5 mg Oral Daily   Continuous Infusions:   LOS: 5 days    Time spent:    Zannie Cove, MD [facility]   03/06/2023, 2:05 PM

## 2023-03-07 DIAGNOSIS — D61818 Other pancytopenia: Secondary | ICD-10-CM | POA: Diagnosis not present

## 2023-03-07 DIAGNOSIS — R531 Weakness: Secondary | ICD-10-CM | POA: Diagnosis not present

## 2023-03-07 DIAGNOSIS — M542 Cervicalgia: Secondary | ICD-10-CM | POA: Diagnosis not present

## 2023-03-07 DIAGNOSIS — R2681 Unsteadiness on feet: Secondary | ICD-10-CM | POA: Diagnosis not present

## 2023-03-07 DIAGNOSIS — M6281 Muscle weakness (generalized): Secondary | ICD-10-CM | POA: Diagnosis not present

## 2023-03-07 DIAGNOSIS — M6259 Muscle wasting and atrophy, not elsewhere classified, multiple sites: Secondary | ICD-10-CM | POA: Diagnosis not present

## 2023-03-07 DIAGNOSIS — I5032 Chronic diastolic (congestive) heart failure: Secondary | ICD-10-CM | POA: Diagnosis not present

## 2023-03-07 DIAGNOSIS — R799 Abnormal finding of blood chemistry, unspecified: Secondary | ICD-10-CM | POA: Diagnosis not present

## 2023-03-07 DIAGNOSIS — I4891 Unspecified atrial fibrillation: Secondary | ICD-10-CM | POA: Diagnosis not present

## 2023-03-07 DIAGNOSIS — I5033 Acute on chronic diastolic (congestive) heart failure: Secondary | ICD-10-CM | POA: Diagnosis not present

## 2023-03-07 DIAGNOSIS — Z741 Need for assistance with personal care: Secondary | ICD-10-CM | POA: Diagnosis not present

## 2023-03-07 DIAGNOSIS — R41841 Cognitive communication deficit: Secondary | ICD-10-CM | POA: Diagnosis not present

## 2023-03-07 DIAGNOSIS — Z7189 Other specified counseling: Secondary | ICD-10-CM | POA: Diagnosis not present

## 2023-03-07 DIAGNOSIS — R1013 Epigastric pain: Secondary | ICD-10-CM | POA: Diagnosis not present

## 2023-03-07 DIAGNOSIS — F419 Anxiety disorder, unspecified: Secondary | ICD-10-CM | POA: Diagnosis not present

## 2023-03-07 DIAGNOSIS — Z7401 Bed confinement status: Secondary | ICD-10-CM | POA: Diagnosis not present

## 2023-03-07 DIAGNOSIS — E7800 Pure hypercholesterolemia, unspecified: Secondary | ICD-10-CM | POA: Diagnosis not present

## 2023-03-07 DIAGNOSIS — R1312 Dysphagia, oropharyngeal phase: Secondary | ICD-10-CM | POA: Diagnosis not present

## 2023-03-07 DIAGNOSIS — Z23 Encounter for immunization: Secondary | ICD-10-CM | POA: Diagnosis not present

## 2023-03-07 DIAGNOSIS — I160 Hypertensive urgency: Secondary | ICD-10-CM | POA: Diagnosis not present

## 2023-03-07 MED ORDER — BISACODYL 10 MG RE SUPP
10.0000 mg | Freq: Once | RECTAL | Status: AC
  Administered 2023-03-07: 10 mg via RECTAL
  Filled 2023-03-07: qty 1

## 2023-03-07 NOTE — Progress Notes (Signed)
Pt seen and examined No changes from DC summary, some constipation getting laxatives today  Zannie Cove, MD

## 2023-03-07 NOTE — Progress Notes (Signed)
Physical Therapy Treatment Patient Details Name: Rebecca Bradshaw MRN: [id number] DOB: Feb 10, 1931 Today's Date: 03/07/2023   History of Present Illness 89 y.o. female presents to [facility] hospital on 03/01/2023 with progressive dyspnea on exertion and was admitted to the hospital with the working diagnosis of heart failure exacerbation. PMHx: hypertension, hyperlipidemia, and atrial fibrillation    PT Comments  Pt admitted with above diagnosis. Pt was able to stand and pivot to recliner with min assist and cues with RW.  Pt continues to need assist and will benefit from post acute rehab < 3 hours day.  Pt currently with functional limitations due to the deficits listed below (see PT Problem List). Pt will benefit from acute skilled PT to increase their independence and safety with mobility to allow discharge.       If plan is discharge home, recommend the following: A lot of help with walking and/or transfers;A lot of help with bathing/dressing/bathroom;Assistance with cooking/housework;Assist for transportation;Help with stairs or ramp for entrance   Can travel by private vehicle     No  Equipment Recommendations  Other (comment) (TBD)    Recommendations for Other Services       Precautions / Restrictions Precautions Precautions: Fall Restrictions Weight Bearing Restrictions: No     Mobility  Bed Mobility Overal bed mobility: Needs Assistance Bed Mobility: Sit to Supine     Supine to sit: Mod assist, HOB elevated     General bed mobility comments: Assist to move LES to EOB as well as elevate trunk.  Use of pad to scoot pt to EOB.    Transfers Overall transfer level: Needs assistance Equipment used: Rolling walker (2 wheels) Transfers: Sit to/from Stand, Bed to chair/wheelchair/BSC Sit to Stand: Min assist   Step pivot transfers: Min assist       General transfer comment: Pt needing mod assist initially to stand without RW.  However, with walker placed in front of pt, pt  was able to stand up to RW with min assist and pivot to recliner with min A and RW. Maintains flexed posture and knees were flexed slightly as well.    Ambulation/Gait                   Stairs             Wheelchair Mobility     Tilt Bed    Modified Rankin (Stroke Patients Only)       Balance Overall balance assessment: Needs assistance Sitting-balance support: Feet supported Sitting balance-Leahy Scale: Good     Standing balance support: Reliant on assistive device for balance, Bilateral upper extremity supported Standing balance-Leahy Scale: Poor Standing balance comment: Relies on external support as well as RW                            Cognition Arousal: Alert Behavior During Therapy: WFL for tasks assessed/performed Overall Cognitive Status: Within Functional Limits for tasks assessed                                 General Comments: overall WFL for basic orientation and simple functional tasks. accepting that she needs rehab and is unsafe to go home alone        Exercises General Exercises - Lower Extremity Ankle Circles/Pumps: AROM, Both, 10 reps, Seated Quad Sets: AROM, Both, 10 reps, Seated Heel Slides: AROM, Both, 10 reps,  Seated    General Comments General comments (skin integrity, edema, etc.): VSS.      Pertinent Vitals/Pain Pain Assessment Pain Assessment: Faces Faces Pain Scale: Hurts a little bit Pain Location: back Pain Descriptors / Indicators: Sore Pain Intervention(s): Limited activity within patient's tolerance, Monitored during session, Repositioned    Home Living                          Prior Function            PT Goals (current goals can now be found in the care plan section) Acute Rehab PT Goals Patient Stated Goal: to improve strength and return to ambulation Progress towards PT goals: Progressing toward goals    Frequency    Min 1X/week      PT Plan       Co-evaluation              AM-PAC PT "6 Clicks" Mobility   Outcome Measure  Help needed turning from your back to your side while in a flat bed without using bedrails?: A Little Help needed moving from lying on your back to sitting on the side of a flat bed without using bedrails?: A Little Help needed moving to and from a bed to a chair (including a wheelchair)?: A Little Help needed standing up from a chair using your arms (e.g., wheelchair or bedside chair)?: A Little Help needed to walk in hospital room?: A Lot Help needed climbing 3-5 steps with a railing? : Total 6 Click Score: 15    End of Session Equipment Utilized During Treatment: Gait belt Activity Tolerance: Patient limited by fatigue Patient left: with call bell/phone within reach;in chair;with chair alarm set Nurse Communication: Mobility status PT Visit Diagnosis: Other abnormalities of gait and mobility (R26.89);Muscle weakness (generalized) (M62.81);Difficulty in walking, not elsewhere classified (R26.2);Unsteadiness on feet (R26.81)     Time: 0981-1914 PT Time Calculation (min) (ACUTE ONLY): 18 min  Charges:    $Therapeutic Activity: 8-22 mins PT General Charges $$ ACUTE PT VISIT: 1 Visit                     [name],PT Acute Rehab Services [phone]    [name] 03/07/2023, 12:09 PM

## 2023-03-07 NOTE — TOC Transition Note (Signed)
Transition of Care [facility]) - CM/SW Discharge Note   Patient Details  Name: Rebecca Bradshaw MRN: [id number] Date of Birth: 1930/08/26  Transition of Care [facility]) CM/SW Contact:  Leander Rams, LCSW Phone Number: 03/07/2023, 1:16 PM   Clinical Narrative:    Patient will DC to: [facility] Anticipated DC date: 03/07/2023 Family notified: [name]  Transport by: Sharin Mons    Per MD patient ready for DC to [facility]. RN, patient, patient's family, and facility notified of DC. Discharge Summary and FL2 sent to facility. RN to call report prior to discharge [phone]. DC packet on chart. Ambulance transport requested for patient.   CSW will sign off for now as social work intervention is no longer needed. Please consult [place] again if new needs arise.    Final next level of care: Skilled Nursing Facility Barriers to Discharge: No Barriers Identified   Patient Goals and CMS Choice   Choice offered to / list presented to : NA  Discharge Placement                Patient chooses bed at: [facility] Patient to be transferred to facility by: [facility] Name of family member notified: [name] Patient and family notified of of transfer: 03/07/23  Discharge Plan and Services Additional resources added to the After Visit Summary for   In-house Referral: NA Discharge Planning Services: CM Consult Post Acute Care Choice: NA          DME Arranged: N/A DME Agency: NA       HH Arranged: NA          Social Determinants of Health (SDOH) Interventions SDOH Screenings   Food Insecurity: No Food Insecurity (03/02/2023)  Housing: Low Risk  (03/02/2023)  Transportation Needs: No Transportation Needs (03/02/2023)  Utilities: Not At Risk (03/02/2023)  Social Connections: Unknown (01/26/2022)   Received from [facility], [facility]  Tobacco Use: Low Risk  (03/01/2023)     Readmission Risk Interventions     No data to display          Oletta Lamas, MSW, LCSWA, LCASA Transitions  of Care  Clinical Social Worker I

## 2023-03-07 NOTE — Discharge Summary (Signed)
Physician Discharge Summary  Rebecca Bradshaw:[id number] DOB: 29-Oct-1930 DOA: 03/01/2023  PCP: Noberto Retort, MD  Admit date: 03/01/2023 Discharge date: 03/07/2023   PLEASE SEE DC SUMMARY as noted by Dr.Arrien Below   Discharge Physician: Coralie Keens    PCP: Noberto Retort, MD    Recommendations at discharge:     Heart failure guideline directive medical therapy with empagliflozin, spironolactone, losartan and carvedilol. Loop diuretic with furosemide  Follow up renal function and electrolytes in 7 days as outpatient, Follow up with Dr Tiburcio Pea in 7 to 10 days.    Discharge Diagnoses: Principal Problem:   Acute on chronic diastolic CHF (congestive heart failure) (HCC) Active Problems:   Atrial fibrillation (HCC)   Hypertensive urgency   Hypokalemia   Pancytopenia (HCC)   Anxiety   Resolved Problems:   * No resolved hospital problems. [facility] Course: Rebecca Bradshaw was admitted to the hospital with the working diagnosis of heart failure exacerbation.    89 yo female with the past medical history of hypertension, hyperlipidemia, and atrial fibrillation who presented with dyspnea and lower extremity edema. She endorsed progressive dyspnea on exertion. She has been hesitant to take diuretics due to polyuria. On 09/19 she was evaluated by cardiology and was found volume overloaded, considering her frailty and ambulatory dysfunction she was referred to the hospital for further diuresis. On her initial physical examination her blood pressure was 198/104, HR 84, RR 23 and 02 saturation 94% on room air, heart with S1 and S2 present, regular rhythm with no gallops or murmurs, lungs with no rhonchi or wheezing, abdomen with no distention and positive lower extremity edema.    Na 140, K 3,9 Cl 100, bicarbonate 29, glucose 99, bun 17 cr 0,82  BNP 169.9 Wbc 3,6 hgb 10,9 plt 103  Urine analysis with SG 1,009, negative for proteins and negative for leukocytes.     Chest radiograph with left rotation, cardiomegaly, bilateral hilar vascular congestion, cephalization of the vasculature and fluid in the right fissure.    EKG 66 bpm, right axis deviation, normal intervals, atrial fibrillation rhythm with no significant ST segment or T wave changes. Poor R R wave progression.    Echocardiogram with preserved LV systolic function.   09/21 volume status has improved, pending PT and OT evaluation.  00/22 patient euvolemic, continue very weak and deconditioned, plan to transfer to SNF for short term rehabilitation.  09/23 patient is medically stable to be transfer to SNF.    Assessment and Plan: * Acute on chronic diastolic CHF (congestive heart failure) (HCC) Echocardiogram with preserved LV systolic function with EF 55 to 60%, mild concentric LVH, RV with low normal systolic function, RVSP 40,3 mmHg, LA and RA with severe dilatation, no significant valvular disease.    Acute on chronic core pulmonale.  Pulmonary hypertension.    Systolic blood pressure 147 to 172 mmHg.  Clinically euvolemic.    Continue with carvedilol 25 mg, spironolactone and SGLT 2 inh.  Losartan and furosemide.  Will allow high systolic blood pressure, up to 180 mmHg, she has significant frailty and has risk of complications from a tight blood pressure control.    Atrial fibrillation (HCC) Continue rate control with carvedilol.  She has been off anticoagulation due to fall risk and frailty.  Continue telemetry monitoring.     Hypertensive urgency Continue with losartan, carvedilol and spironolactone.    Hypokalemia Electrolytes have been corrected, discharge K is 4,7 with serum bicarbonate at 29 and  Na 135 Cr is 0,95.      Plan to continue diuretic therapy with furosemide, spironolactone and SGLT 2 inh.   Pancytopenia (HCC) Cell count has been stable.    Anxiety Continue with diazepam per her home regimen.                Consultants: none  Procedures performed:  none   Disposition: Skilled nursing facility Diet recommendation:  Cardiac diet DISCHARGE MEDICATION: Allergies as of 03/06/2023         Reactions    Codeine Nausea And Vomiting    Metoprolol Other (See Comments)    "Nightmares"    Morphine And Codeine Nausea And Vomiting    Pantoprazole Other (See Comments)    nightmares            Medication List       TAKE these medications     acetaminophen 325 MG tablet Commonly known as: TYLENOL Take 650 mg by mouth daily as needed for mild pain.    aspirin 81 MG tablet Take 1 tablet (81 mg total) by mouth daily.    calcium-vitamin D 500-200 MG-UNIT tablet Commonly known as: OSCAL WITH D Take 1 tablet by mouth daily.    carvedilol 25 MG tablet Commonly known as: COREG TAKE 1 TABLET BY MOUTH TWICE DAILY WITH A MEAL    diazepam 5 MG tablet Commonly known as: VALIUM Take 2.5 mg by mouth 2 (two) times daily. Take 0.5 tablet (2.5 mg total) by mouth every morning and take 1 tablet (5 mg total) by mouth every evening.    empagliflozin 10 MG Tabs tablet Commonly known as: JARDIANCE Take 1 tablet (10 mg total) by mouth daily. Start taking on: March 07, 2023    furosemide 20 MG tablet Commonly known as: LASIX Take 1 tablet (20 mg total) by mouth daily. Start taking on: March 07, 2023    losartan 25 MG tablet Commonly known as: COZAAR Take 1 tablet (25 mg total) by mouth daily. Start taking on: March 07, 2023    meclizine 25 MG tablet Commonly known as: ANTIVERT Take 25 mg by mouth 3 (three) times daily as needed for dizziness.    ofloxacin 0.3 % OTIC solution Commonly known as: FLOXIN Place 5 drops in ear(s) 2 (two) times daily.    polyethylene glycol 17 g packet Commonly known as: MIRALAX / GLYCOLAX Take 17 g by mouth daily. Start taking on: March 07, 2023    pravastatin 40 MG tablet Commonly known as: PRAVACHOL Take 1 tablet (40 mg total) by mouth daily.    PRESERVISION AREDS PO Take by mouth.     REFRESH OP Place 2 drops into both eyes 2 (two) times daily as needed (DRY EYES).    spironolactone 25 MG tablet Commonly known as: ALDACTONE Take 0.5 tablets (12.5 mg total) by mouth daily. Start taking on: March 07, 2023    VITAMIN D PO Take 1 Units by mouth daily.             Contact information for after-discharge care       Destination       [facility] Preferred SNF .   Service: Skilled Nursing Contact information: [address] [place] [address] [phone]                             Discharge Exam:      Ceasar Mons Weights  03/04/23 0444 03/05/23 0408 03/06/23 0513  Weight: 49.1 kg 53.5 kg 50.5 kg    BP (!) 103/57 (BP Location: Right Arm)   Pulse 66   Temp 98.5 F (36.9 C) (Oral)   Resp 18   Ht 5\' 3"  (1.6 m)   Wt 50.5 kg   SpO2 96%   BMI 19.72 kg/m    Patient with no chest pain or dyspnea, no lower extremity edema. Today with constipation    Neurology awake and alert ENT with mild pallor Cardiovascular with S1 and S2 present, irregularly irregular with no gallops, rubs or murmurs No JVD No lower extremity edema Respiratory with no rales or wheezing, no rhonchi Abdomen with no distention    Condition at discharge: stable   The results of significant diagnostics from this hospitalization (including imaging, microbiology, ancillary and laboratory) are listed below for reference.    Imaging Studies:  Imaging Results  ECHOCARDIOGRAM COMPLETE   Result Date: 03/02/2023    ECHOCARDIOGRAM REPORT   Patient Name:   TEIA GRANGER Date of Exam: 03/02/2023 Medical Rec #:  [id number]        Height:       63.0 in Accession #:    [id number]       Weight:       115.7 lb Date of Birth:  September 22, 1930        BSA:          1.533 m Patient Age:    89 years         BP:           193/96 mmHg Patient Gender: F                HR:           74 bpm. Exam Location:  Inpatient Procedure: 2D Echo, Color Doppler and Cardiac  Doppler Indications:    acute diastolic chf  History:        Patient has prior history of Echocardiogram examinations, most                 recent 12/10/2007. CHF, Arrythmias:Atrial Fibrillation; Risk                 Factors:Hypertension and Dyslipidemia.  Sonographer:    Delcie Roch RDCS Referring Phys: [id number] TIMOTHY S OPYD IMPRESSIONS  1. Technically difficult study with poor visualization of cardiac structures.  2. Left ventricular ejection fraction, by estimation, is 55 to 60%. The left ventricle has normal function. The left ventricle has no regional wall motion abnormalities. There is mild concentric left ventricular hypertrophy. Left ventricular diastolic function could not be evaluated. Elevated left atrial pressure.  3. Right ventricular systolic function is low normal. The right ventricular size is normal. There is mildly elevated pulmonary artery systolic pressure. The estimated right ventricular systolic pressure is 40.3 mmHg.  4. Left atrial size was severely dilated.  5. Right atrial size was severely dilated.  6. The mitral valve is normal in structure. Trivial mitral valve regurgitation.  7. The aortic valve is normal in structure. There is mild calcification of the aortic valve. Aortic valve regurgitation is not visualized.  8. The inferior vena cava is dilated in size with >50% respiratory variability, suggesting right atrial pressure of 8 mmHg. FINDINGS  Left Ventricle: Left ventricular ejection fraction, by estimation, is 55 to 60%. The left ventricle has normal function. The left ventricle has no regional wall motion abnormalities. The left ventricular internal cavity size was normal  in size. There is  mild concentric left ventricular hypertrophy. Left ventricular diastolic function could not be evaluated due to nondiagnostic images. Left ventricular diastolic function could not be evaluated. Elevated left atrial pressure. Right Ventricle: The right ventricular size is normal. No increase  in right ventricular wall thickness. Right ventricular systolic function is low normal. There is mildly elevated pulmonary artery systolic pressure. The tricuspid regurgitant velocity is 2.97 m/s, and with an assumed right atrial pressure of 5 mmHg, the estimated right ventricular systolic pressure is 40.3 mmHg. Left Atrium: Left atrial size was severely dilated. Right Atrium: Right atrial size was severely dilated. Pericardium: There is no evidence of pericardial effusion. Mitral Valve: The mitral valve is normal in structure. Mild mitral annular calcification. Trivial mitral valve regurgitation. Tricuspid Valve: The tricuspid valve is grossly normal. Tricuspid valve regurgitation is trivial. Aortic Valve: The aortic valve is normal in structure. There is mild calcification of the aortic valve. Aortic valve regurgitation is not visualized. Pulmonic Valve: The pulmonic valve was normal in structure. Pulmonic valve regurgitation is not visualized. Aorta: The aortic root was not well visualized and the aortic root is normal in size and structure. Venous: The inferior vena cava is dilated in size with greater than 50% respiratory variability, suggesting right atrial pressure of 8 mmHg. IAS/Shunts: No atrial level shunt detected by color flow Doppler.  LEFT VENTRICLE PLAX 2D LVOT diam:     1.80 cm LV SV:         27 LV SV Index:   18 LVOT Area:     2.54 cm  RIGHT VENTRICLE             IVC RV Basal diam:  3.00 cm     IVC diam: 2.30 cm RV S prime:     10.60 cm/s TAPSE (M-mode): 1.4 cm LEFT ATRIUM              Index        RIGHT ATRIUM           Index LA Vol (A2C):   107.0 ml 69.82 ml/m  RA Area:     23.00 cm LA Vol (A4C):   88.7 ml  57.88 ml/m  RA Volume:   69.60 ml  45.41 ml/m LA Biplane Vol: 99.0 ml  64.60 ml/m  AORTIC VALVE LVOT Vmax:   69.70 cm/s LVOT Vmean:  44.700 cm/s LVOT VTI:    0.106 m  AORTA Ao Root diam: 2.80 cm Ao Asc diam:  3.70 cm TRICUSPID VALVE TR Peak grad:   35.3 mmHg TR Vmax:        297.00 cm/s   SHUNTS Systemic VTI:  0.11 m Systemic Diam: 1.80 cm Aditya Sabharwal Electronically signed by Dorthula Nettles Signature Date/Time: 03/02/2023/2:09:28 PM    Final     DG Abd Portable 1V   Result Date: 03/01/2023 CLINICAL DATA:  Lower abdominal pain EXAM: PORTABLE ABDOMEN - 1 VIEW COMPARISON:  12/31/2015 FINDINGS: Scattered large and small bowel gas is noted. Mild retained fecal material is noted. No obstructive changes are seen. No free air is noted. Degenerative changes of the lumbar spine are noted. IMPRESSION: No acute abnormality seen. Electronically Signed   By: Alcide Clever M.D.   On: 03/01/2023 21:25    DG Chest Port 1 View   Result Date: 03/01/2023 CLINICAL DATA:  Shortness of breath EXAM: PORTABLE CHEST 1 VIEW COMPARISON:  Abdominal series with chest x-ray 12/31/2015 FINDINGS: The heart is enlarged. The left costophrenic angle has been  excluded from the exam. The lungs otherwise appear clear. There is no pneumothorax or acute fracture. IMPRESSION: Cardiomegaly. No acute cardiopulmonary process. Electronically Signed   By: Darliss Cheney M.D.   On: 03/01/2023 19:17       Microbiology: >        Results for orders placed or performed in visit on 06/26/13  Urine Culture     Status: None    Collection Time: 06/26/13  4:33 PM    Specimen: Urine  Result Value Ref Range Status    Colony Count NO GROWTH   Final    Organism ID, Bacteria NO GROWTH   Final        Labs: CBC: Last Labs      Recent Labs  Lab 03/01/23 1721 03/02/23 0334  WBC 3.6* 3.6*  NEUTROABS 1.7  --   HGB 10.9* 10.7*  HCT 35.0* 33.8*  MCV 88.8 87.6  PLT 103* 105*      Basic Metabolic Panel: Last Labs         Recent Labs  Lab 03/02/23 0334 03/03/23 0301 03/04/23 0237 03/05/23 0301 03/06/23 0245  NA 137 136 135 135 135  K 3.2* 4.0 3.8 4.7 4.7  CL 97* 95* 92* 95* 98  CO2 31 32 32 31 29  GLUCOSE 92 112* 110* 114* 112*  BUN 14 16 24* 31* 27*  CREATININE 0.74 1.01* 0.99 1.18* 0.95  CALCIUM 8.6* 9.2 9.3  8.8* 8.9  MG 1.9 2.1 2.3  --   --   PHOS  --   --   --   --  4.1      Liver Function Tests: Last Labs      Recent Labs  Lab 03/01/23 1721 03/06/23 0245  AST 24  --   ALT 19  --   ALKPHOS 61  --   BILITOT 0.9  --   PROT 6.4*  --   ALBUMIN 3.8 3.4*      CBG: Last Labs  No results for input(s): "GLUCAP" in the last 168 hours.     Discharge time spent: greater than 30 minutes.   Signed: Coralie Keens, MD [facility] 03/06/2023

## 2023-03-07 NOTE — Plan of Care (Signed)

## 2023-03-07 NOTE — TOC Progression Note (Signed)
Transition of Care [facility]) - Progression Note    Patient Details  Name: Rebecca Bradshaw MRN: [id number] Date of Birth: 1930/07/24  [facility] CM/SW Contact  Carmina Miller, LCSWA Phone Number: 03/07/2023, 10:38 AM  Clinical Narrative:     CSW spoke with pt's daughter via phone about the bed at [facility], apparently daughter spoke with [facility] AD and asked if pt could be transferred to [facility] after admission. At this time, Sonny Dandy is waiting on daughter to complete paperwork and at that time the decision will be made on extending an offer after discussing expectations of the family. Daughter states she will be at [facility] at 11:30 am to complete paperwork. TOC will continue to follow.   Expected Discharge Plan: Home w Home Health Services Barriers to Discharge: Continued Medical Work up  Expected Discharge Plan and Services In-house Referral: NA Discharge Planning Services: CM Consult Post Acute Care Choice: NA Living arrangements for the past 2 months: Single Family Home Expected Discharge Date: 03/07/23               DME Arranged: N/A DME Agency: NA       HH Arranged: NA           Social Determinants of Health (SDOH) Interventions SDOH Screenings   Food Insecurity: No Food Insecurity (03/02/2023)  Housing: Low Risk  (03/02/2023)  Transportation Needs: No Transportation Needs (03/02/2023)  Utilities: Not At Risk (03/02/2023)  Social Connections: Unknown (01/26/2022)   Received from [facility]  Tobacco Use: Low Risk  (03/01/2023)    Readmission Risk Interventions     No data to display

## 2023-03-07 NOTE — Progress Notes (Signed)
Report called to [place], RN at [place]  [phone]. Family at bedside updated.

## 2023-03-08 DIAGNOSIS — Z7189 Other specified counseling: Secondary | ICD-10-CM | POA: Diagnosis not present

## 2023-03-08 DIAGNOSIS — I5033 Acute on chronic diastolic (congestive) heart failure: Secondary | ICD-10-CM | POA: Diagnosis not present

## 2023-03-09 DIAGNOSIS — I5033 Acute on chronic diastolic (congestive) heart failure: Secondary | ICD-10-CM | POA: Diagnosis not present

## 2023-03-12 DIAGNOSIS — I5033 Acute on chronic diastolic (congestive) heart failure: Secondary | ICD-10-CM | POA: Diagnosis not present

## 2023-03-12 DIAGNOSIS — I4891 Unspecified atrial fibrillation: Secondary | ICD-10-CM | POA: Diagnosis not present

## 2023-03-14 DIAGNOSIS — I5032 Chronic diastolic (congestive) heart failure: Secondary | ICD-10-CM | POA: Diagnosis not present

## 2023-03-14 DIAGNOSIS — M6281 Muscle weakness (generalized): Secondary | ICD-10-CM | POA: Diagnosis not present

## 2023-03-14 DIAGNOSIS — Z741 Need for assistance with personal care: Secondary | ICD-10-CM | POA: Diagnosis not present

## 2023-03-14 DIAGNOSIS — R2681 Unsteadiness on feet: Secondary | ICD-10-CM | POA: Diagnosis not present

## 2023-03-15 DIAGNOSIS — R799 Abnormal finding of blood chemistry, unspecified: Secondary | ICD-10-CM | POA: Diagnosis not present

## 2023-03-15 DIAGNOSIS — M542 Cervicalgia: Secondary | ICD-10-CM | POA: Diagnosis not present

## 2023-03-19 DIAGNOSIS — I5033 Acute on chronic diastolic (congestive) heart failure: Secondary | ICD-10-CM | POA: Diagnosis not present

## 2023-03-19 DIAGNOSIS — M542 Cervicalgia: Secondary | ICD-10-CM | POA: Diagnosis not present

## 2023-03-21 DIAGNOSIS — M6281 Muscle weakness (generalized): Secondary | ICD-10-CM | POA: Diagnosis not present

## 2023-03-21 DIAGNOSIS — I5032 Chronic diastolic (congestive) heart failure: Secondary | ICD-10-CM | POA: Diagnosis not present

## 2023-03-21 DIAGNOSIS — M542 Cervicalgia: Secondary | ICD-10-CM | POA: Diagnosis not present

## 2023-03-21 DIAGNOSIS — Z741 Need for assistance with personal care: Secondary | ICD-10-CM | POA: Diagnosis not present

## 2023-03-21 DIAGNOSIS — R2681 Unsteadiness on feet: Secondary | ICD-10-CM | POA: Diagnosis not present

## 2023-03-22 DIAGNOSIS — I5033 Acute on chronic diastolic (congestive) heart failure: Secondary | ICD-10-CM | POA: Diagnosis not present

## 2023-03-22 DIAGNOSIS — E7800 Pure hypercholesterolemia, unspecified: Secondary | ICD-10-CM | POA: Diagnosis not present

## 2023-03-26 DIAGNOSIS — F419 Anxiety disorder, unspecified: Secondary | ICD-10-CM | POA: Diagnosis not present

## 2023-03-26 DIAGNOSIS — E7800 Pure hypercholesterolemia, unspecified: Secondary | ICD-10-CM | POA: Diagnosis not present

## 2023-03-27 DIAGNOSIS — I160 Hypertensive urgency: Secondary | ICD-10-CM | POA: Diagnosis not present

## 2023-03-27 DIAGNOSIS — R1010 Upper abdominal pain, unspecified: Secondary | ICD-10-CM | POA: Diagnosis not present

## 2023-03-27 DIAGNOSIS — I5033 Acute on chronic diastolic (congestive) heart failure: Secondary | ICD-10-CM | POA: Diagnosis not present

## 2023-03-27 DIAGNOSIS — I4891 Unspecified atrial fibrillation: Secondary | ICD-10-CM | POA: Diagnosis not present

## 2023-03-29 DIAGNOSIS — R109 Unspecified abdominal pain: Secondary | ICD-10-CM | POA: Diagnosis not present

## 2023-03-29 DIAGNOSIS — K5900 Constipation, unspecified: Secondary | ICD-10-CM | POA: Diagnosis not present

## 2023-03-30 DIAGNOSIS — R799 Abnormal finding of blood chemistry, unspecified: Secondary | ICD-10-CM | POA: Diagnosis not present

## 2023-04-10 DIAGNOSIS — E7800 Pure hypercholesterolemia, unspecified: Secondary | ICD-10-CM | POA: Diagnosis not present

## 2023-04-10 DIAGNOSIS — R350 Frequency of micturition: Secondary | ICD-10-CM | POA: Diagnosis not present

## 2023-04-10 DIAGNOSIS — I4891 Unspecified atrial fibrillation: Secondary | ICD-10-CM | POA: Diagnosis not present

## 2023-04-10 DIAGNOSIS — I5032 Chronic diastolic (congestive) heart failure: Secondary | ICD-10-CM | POA: Diagnosis not present

## 2023-04-12 DIAGNOSIS — F4323 Adjustment disorder with mixed anxiety and depressed mood: Secondary | ICD-10-CM | POA: Diagnosis not present

## 2023-04-13 DIAGNOSIS — F4323 Adjustment disorder with mixed anxiety and depressed mood: Secondary | ICD-10-CM | POA: Diagnosis not present

## 2023-04-17 DIAGNOSIS — I5089 Other heart failure: Secondary | ICD-10-CM | POA: Diagnosis not present

## 2023-04-17 DIAGNOSIS — K5900 Constipation, unspecified: Secondary | ICD-10-CM | POA: Diagnosis not present

## 2023-04-17 DIAGNOSIS — M6281 Muscle weakness (generalized): Secondary | ICD-10-CM | POA: Diagnosis not present

## 2023-05-04 DIAGNOSIS — K5900 Constipation, unspecified: Secondary | ICD-10-CM | POA: Diagnosis not present

## 2023-05-08 DIAGNOSIS — E7800 Pure hypercholesterolemia, unspecified: Secondary | ICD-10-CM | POA: Diagnosis not present

## 2023-05-08 DIAGNOSIS — I5032 Chronic diastolic (congestive) heart failure: Secondary | ICD-10-CM | POA: Diagnosis not present

## 2023-05-08 DIAGNOSIS — I4891 Unspecified atrial fibrillation: Secondary | ICD-10-CM | POA: Diagnosis not present

## 2023-05-08 DIAGNOSIS — R350 Frequency of micturition: Secondary | ICD-10-CM | POA: Diagnosis not present

## 2023-05-17 DIAGNOSIS — I4891 Unspecified atrial fibrillation: Secondary | ICD-10-CM | POA: Diagnosis not present

## 2023-05-17 DIAGNOSIS — E7800 Pure hypercholesterolemia, unspecified: Secondary | ICD-10-CM | POA: Diagnosis not present

## 2023-05-17 DIAGNOSIS — J069 Acute upper respiratory infection, unspecified: Secondary | ICD-10-CM | POA: Diagnosis not present

## 2023-05-17 DIAGNOSIS — I5033 Acute on chronic diastolic (congestive) heart failure: Secondary | ICD-10-CM | POA: Diagnosis not present

## 2023-05-18 DIAGNOSIS — M79674 Pain in right toe(s): Secondary | ICD-10-CM | POA: Diagnosis not present

## 2023-05-18 DIAGNOSIS — J069 Acute upper respiratory infection, unspecified: Secondary | ICD-10-CM | POA: Diagnosis not present

## 2023-05-18 DIAGNOSIS — B9689 Other specified bacterial agents as the cause of diseases classified elsewhere: Secondary | ICD-10-CM | POA: Diagnosis not present

## 2023-05-25 DIAGNOSIS — J069 Acute upper respiratory infection, unspecified: Secondary | ICD-10-CM | POA: Diagnosis not present

## 2023-05-30 DIAGNOSIS — I5033 Acute on chronic diastolic (congestive) heart failure: Secondary | ICD-10-CM | POA: Diagnosis not present

## 2023-05-30 DIAGNOSIS — L309 Dermatitis, unspecified: Secondary | ICD-10-CM | POA: Diagnosis not present

## 2023-05-30 DIAGNOSIS — M542 Cervicalgia: Secondary | ICD-10-CM | POA: Diagnosis not present

## 2023-05-30 DIAGNOSIS — J370 Chronic laryngitis: Secondary | ICD-10-CM | POA: Diagnosis not present

## 2023-06-01 DIAGNOSIS — F4323 Adjustment disorder with mixed anxiety and depressed mood: Secondary | ICD-10-CM | POA: Diagnosis not present

## 2023-06-07 ENCOUNTER — Encounter: Payer: Self-pay | Admitting: Podiatry

## 2023-06-07 ENCOUNTER — Ambulatory Visit: Payer: Medicare Other | Admitting: Podiatry

## 2023-06-07 DIAGNOSIS — S90229A Contusion of unspecified lesser toe(s) with damage to nail, initial encounter: Secondary | ICD-10-CM

## 2023-06-07 DIAGNOSIS — M79674 Pain in right toe(s): Secondary | ICD-10-CM

## 2023-06-07 DIAGNOSIS — M79675 Pain in left toe(s): Secondary | ICD-10-CM | POA: Diagnosis not present

## 2023-06-07 DIAGNOSIS — B351 Tinea unguium: Secondary | ICD-10-CM

## 2023-06-07 DIAGNOSIS — D649 Anemia, unspecified: Secondary | ICD-10-CM | POA: Diagnosis not present

## 2023-06-07 NOTE — Progress Notes (Signed)
  Subjective:  Patient ID: Rebecca Bradshaw, female    DOB: [birth year],  MRN: [id number]  Chief Complaint  Patient presents with   Nail Problem    She has the 4th nail on the right toe and she has noticed a part of the nail come off after a string was attached to the 4th nail. She had a 4th toe nail come off on left foot as well. She was told by her doctor that she may have a ingrown toenail on both big toes.     89 y.o. female presents with the above complaint. History confirmed with patient. Patient presenting with pain related to dystrophic thickened elongated nails. Patient is unable to trim own nails related to nail dystrophy and/or mobility issues. Patient does not have a history of T2DM.  Reports tenderness to the medial lateral borders of the first toes bilaterally.  Also has dried blood underneath the fourth toenail right side patient appreciated.  Left fourth toenail appears healed but the patient states that it came loose with an aid removing her sock.  Objective:  Physical Exam: warm, good capillary refill nail exam onychomycosis of the toenails, onycholysis, dystrophic nails of all nail plates and subungual hematoma of the right 4th toe, stable appearing. DP pulses palpable, PT pulses palpable, and protective sensation intact Left Foot:  Pain with palpation of nails due to elongation and dystrophic growth.  Right Foot: Pain with palpation of nails due to elongation and dystrophic growth.  Bilateral first toenails there is mild incurvation to the medial lateral border without associated redness or swelling.  Assessment:   1. Pain due to onychomycosis of toenails of both feet      Plan:  Patient was evaluated and treated and all questions answered.  #Onychomycosis with pain  -Nails palliatively debrided as below. -Educated on self-care -Slant back procedures performed for the great toenails bilaterally  # Right fourth toe subungual hematoma - Appears stable nature - Nail  was trimmed and debrided without incident.  Procedure: Nail Debridement Rationale: Pain Type of Debridement: manual, sharp debridement. Instrumentation: Nail nipper, rotary burr. Number of Nails: 9  Return in about 3 months (around 09/05/2023) for Routine Foot Care.         Bronwen Betters, DPM [facility] / [facility]

## 2023-06-12 DIAGNOSIS — I10 Essential (primary) hypertension: Secondary | ICD-10-CM | POA: Diagnosis not present

## 2023-06-12 DIAGNOSIS — F515 Nightmare disorder: Secondary | ICD-10-CM | POA: Diagnosis not present

## 2023-06-12 DIAGNOSIS — G4700 Insomnia, unspecified: Secondary | ICD-10-CM | POA: Diagnosis not present

## 2023-06-12 DIAGNOSIS — F419 Anxiety disorder, unspecified: Secondary | ICD-10-CM | POA: Diagnosis not present

## 2023-06-19 DIAGNOSIS — R109 Unspecified abdominal pain: Secondary | ICD-10-CM | POA: Diagnosis not present

## 2023-06-22 DIAGNOSIS — I10 Essential (primary) hypertension: Secondary | ICD-10-CM | POA: Diagnosis not present

## 2023-06-22 DIAGNOSIS — I4891 Unspecified atrial fibrillation: Secondary | ICD-10-CM | POA: Diagnosis not present

## 2023-06-22 DIAGNOSIS — F411 Generalized anxiety disorder: Secondary | ICD-10-CM | POA: Diagnosis not present

## 2023-06-22 DIAGNOSIS — L309 Dermatitis, unspecified: Secondary | ICD-10-CM | POA: Diagnosis not present

## 2023-07-05 DIAGNOSIS — F4323 Adjustment disorder with mixed anxiety and depressed mood: Secondary | ICD-10-CM | POA: Diagnosis not present

## 2023-07-05 DIAGNOSIS — F5105 Insomnia due to other mental disorder: Secondary | ICD-10-CM | POA: Diagnosis not present

## 2023-07-10 DIAGNOSIS — I10 Essential (primary) hypertension: Secondary | ICD-10-CM | POA: Diagnosis not present

## 2023-07-10 DIAGNOSIS — M6281 Muscle weakness (generalized): Secondary | ICD-10-CM | POA: Diagnosis not present

## 2023-07-10 DIAGNOSIS — R2681 Unsteadiness on feet: Secondary | ICD-10-CM | POA: Diagnosis not present

## 2023-07-10 DIAGNOSIS — R42 Dizziness and giddiness: Secondary | ICD-10-CM | POA: Diagnosis not present

## 2023-07-10 DIAGNOSIS — I5033 Acute on chronic diastolic (congestive) heart failure: Secondary | ICD-10-CM | POA: Diagnosis not present

## 2023-07-10 DIAGNOSIS — F515 Nightmare disorder: Secondary | ICD-10-CM | POA: Diagnosis not present

## 2023-07-11 DIAGNOSIS — M6281 Muscle weakness (generalized): Secondary | ICD-10-CM | POA: Diagnosis not present

## 2023-07-11 DIAGNOSIS — R2681 Unsteadiness on feet: Secondary | ICD-10-CM | POA: Diagnosis not present

## 2023-07-11 DIAGNOSIS — I5033 Acute on chronic diastolic (congestive) heart failure: Secondary | ICD-10-CM | POA: Diagnosis not present

## 2023-07-13 DIAGNOSIS — R2681 Unsteadiness on feet: Secondary | ICD-10-CM | POA: Diagnosis not present

## 2023-07-13 DIAGNOSIS — F419 Anxiety disorder, unspecified: Secondary | ICD-10-CM | POA: Diagnosis not present

## 2023-07-13 DIAGNOSIS — I5033 Acute on chronic diastolic (congestive) heart failure: Secondary | ICD-10-CM | POA: Diagnosis not present

## 2023-07-13 DIAGNOSIS — M542 Cervicalgia: Secondary | ICD-10-CM | POA: Diagnosis not present

## 2023-07-13 DIAGNOSIS — M6281 Muscle weakness (generalized): Secondary | ICD-10-CM | POA: Diagnosis not present

## 2023-07-13 DIAGNOSIS — B3731 Acute candidiasis of vulva and vagina: Secondary | ICD-10-CM | POA: Diagnosis not present

## 2023-07-16 DIAGNOSIS — I5033 Acute on chronic diastolic (congestive) heart failure: Secondary | ICD-10-CM | POA: Diagnosis not present

## 2023-07-16 DIAGNOSIS — M6281 Muscle weakness (generalized): Secondary | ICD-10-CM | POA: Diagnosis not present

## 2023-07-16 DIAGNOSIS — R2681 Unsteadiness on feet: Secondary | ICD-10-CM | POA: Diagnosis not present

## 2023-07-16 DIAGNOSIS — R41841 Cognitive communication deficit: Secondary | ICD-10-CM | POA: Diagnosis not present

## 2023-07-16 DIAGNOSIS — R1312 Dysphagia, oropharyngeal phase: Secondary | ICD-10-CM | POA: Diagnosis not present

## 2023-07-17 DIAGNOSIS — M6281 Muscle weakness (generalized): Secondary | ICD-10-CM | POA: Diagnosis not present

## 2023-07-17 DIAGNOSIS — R41841 Cognitive communication deficit: Secondary | ICD-10-CM | POA: Diagnosis not present

## 2023-07-17 DIAGNOSIS — R2681 Unsteadiness on feet: Secondary | ICD-10-CM | POA: Diagnosis not present

## 2023-07-17 DIAGNOSIS — I5033 Acute on chronic diastolic (congestive) heart failure: Secondary | ICD-10-CM | POA: Diagnosis not present

## 2023-07-17 DIAGNOSIS — R1312 Dysphagia, oropharyngeal phase: Secondary | ICD-10-CM | POA: Diagnosis not present

## 2023-07-18 DIAGNOSIS — R2681 Unsteadiness on feet: Secondary | ICD-10-CM | POA: Diagnosis not present

## 2023-07-18 DIAGNOSIS — M6281 Muscle weakness (generalized): Secondary | ICD-10-CM | POA: Diagnosis not present

## 2023-07-18 DIAGNOSIS — R1312 Dysphagia, oropharyngeal phase: Secondary | ICD-10-CM | POA: Diagnosis not present

## 2023-07-18 DIAGNOSIS — R41841 Cognitive communication deficit: Secondary | ICD-10-CM | POA: Diagnosis not present

## 2023-07-18 DIAGNOSIS — I5033 Acute on chronic diastolic (congestive) heart failure: Secondary | ICD-10-CM | POA: Diagnosis not present

## 2023-07-19 DIAGNOSIS — I5033 Acute on chronic diastolic (congestive) heart failure: Secondary | ICD-10-CM | POA: Diagnosis not present

## 2023-07-19 DIAGNOSIS — R41841 Cognitive communication deficit: Secondary | ICD-10-CM | POA: Diagnosis not present

## 2023-07-19 DIAGNOSIS — R1312 Dysphagia, oropharyngeal phase: Secondary | ICD-10-CM | POA: Diagnosis not present

## 2023-07-19 DIAGNOSIS — R2681 Unsteadiness on feet: Secondary | ICD-10-CM | POA: Diagnosis not present

## 2023-07-19 DIAGNOSIS — M6281 Muscle weakness (generalized): Secondary | ICD-10-CM | POA: Diagnosis not present

## 2023-07-24 DIAGNOSIS — I5033 Acute on chronic diastolic (congestive) heart failure: Secondary | ICD-10-CM | POA: Diagnosis not present

## 2023-07-24 DIAGNOSIS — M6281 Muscle weakness (generalized): Secondary | ICD-10-CM | POA: Diagnosis not present

## 2023-07-24 DIAGNOSIS — R2681 Unsteadiness on feet: Secondary | ICD-10-CM | POA: Diagnosis not present

## 2023-07-24 DIAGNOSIS — R41841 Cognitive communication deficit: Secondary | ICD-10-CM | POA: Diagnosis not present

## 2023-07-24 DIAGNOSIS — R1312 Dysphagia, oropharyngeal phase: Secondary | ICD-10-CM | POA: Diagnosis not present

## 2023-07-25 DIAGNOSIS — R2681 Unsteadiness on feet: Secondary | ICD-10-CM | POA: Diagnosis not present

## 2023-07-25 DIAGNOSIS — I5033 Acute on chronic diastolic (congestive) heart failure: Secondary | ICD-10-CM | POA: Diagnosis not present

## 2023-07-25 DIAGNOSIS — M6281 Muscle weakness (generalized): Secondary | ICD-10-CM | POA: Diagnosis not present

## 2023-07-25 DIAGNOSIS — R41841 Cognitive communication deficit: Secondary | ICD-10-CM | POA: Diagnosis not present

## 2023-07-25 DIAGNOSIS — R1312 Dysphagia, oropharyngeal phase: Secondary | ICD-10-CM | POA: Diagnosis not present

## 2023-07-26 DIAGNOSIS — R41841 Cognitive communication deficit: Secondary | ICD-10-CM | POA: Diagnosis not present

## 2023-07-26 DIAGNOSIS — I5033 Acute on chronic diastolic (congestive) heart failure: Secondary | ICD-10-CM | POA: Diagnosis not present

## 2023-07-26 DIAGNOSIS — R2681 Unsteadiness on feet: Secondary | ICD-10-CM | POA: Diagnosis not present

## 2023-07-26 DIAGNOSIS — R1312 Dysphagia, oropharyngeal phase: Secondary | ICD-10-CM | POA: Diagnosis not present

## 2023-07-26 DIAGNOSIS — M6281 Muscle weakness (generalized): Secondary | ICD-10-CM | POA: Diagnosis not present

## 2023-07-27 DIAGNOSIS — M6281 Muscle weakness (generalized): Secondary | ICD-10-CM | POA: Diagnosis not present

## 2023-07-27 DIAGNOSIS — I5033 Acute on chronic diastolic (congestive) heart failure: Secondary | ICD-10-CM | POA: Diagnosis not present

## 2023-07-27 DIAGNOSIS — R2681 Unsteadiness on feet: Secondary | ICD-10-CM | POA: Diagnosis not present

## 2023-07-27 DIAGNOSIS — R41841 Cognitive communication deficit: Secondary | ICD-10-CM | POA: Diagnosis not present

## 2023-07-27 DIAGNOSIS — R1312 Dysphagia, oropharyngeal phase: Secondary | ICD-10-CM | POA: Diagnosis not present

## 2023-07-31 DIAGNOSIS — I5033 Acute on chronic diastolic (congestive) heart failure: Secondary | ICD-10-CM | POA: Diagnosis not present

## 2023-07-31 DIAGNOSIS — M6281 Muscle weakness (generalized): Secondary | ICD-10-CM | POA: Diagnosis not present

## 2023-07-31 DIAGNOSIS — R41841 Cognitive communication deficit: Secondary | ICD-10-CM | POA: Diagnosis not present

## 2023-07-31 DIAGNOSIS — R2681 Unsteadiness on feet: Secondary | ICD-10-CM | POA: Diagnosis not present

## 2023-07-31 DIAGNOSIS — R1312 Dysphagia, oropharyngeal phase: Secondary | ICD-10-CM | POA: Diagnosis not present

## 2023-08-01 DIAGNOSIS — R41841 Cognitive communication deficit: Secondary | ICD-10-CM | POA: Diagnosis not present

## 2023-08-01 DIAGNOSIS — M6281 Muscle weakness (generalized): Secondary | ICD-10-CM | POA: Diagnosis not present

## 2023-08-01 DIAGNOSIS — R2681 Unsteadiness on feet: Secondary | ICD-10-CM | POA: Diagnosis not present

## 2023-08-01 DIAGNOSIS — R1312 Dysphagia, oropharyngeal phase: Secondary | ICD-10-CM | POA: Diagnosis not present

## 2023-08-01 DIAGNOSIS — I5033 Acute on chronic diastolic (congestive) heart failure: Secondary | ICD-10-CM | POA: Diagnosis not present

## 2023-08-02 DIAGNOSIS — R41841 Cognitive communication deficit: Secondary | ICD-10-CM | POA: Diagnosis not present

## 2023-08-02 DIAGNOSIS — R2681 Unsteadiness on feet: Secondary | ICD-10-CM | POA: Diagnosis not present

## 2023-08-02 DIAGNOSIS — R1312 Dysphagia, oropharyngeal phase: Secondary | ICD-10-CM | POA: Diagnosis not present

## 2023-08-02 DIAGNOSIS — I5033 Acute on chronic diastolic (congestive) heart failure: Secondary | ICD-10-CM | POA: Diagnosis not present

## 2023-08-02 DIAGNOSIS — M6281 Muscle weakness (generalized): Secondary | ICD-10-CM | POA: Diagnosis not present

## 2023-08-03 DIAGNOSIS — R41841 Cognitive communication deficit: Secondary | ICD-10-CM | POA: Diagnosis not present

## 2023-08-03 DIAGNOSIS — I5033 Acute on chronic diastolic (congestive) heart failure: Secondary | ICD-10-CM | POA: Diagnosis not present

## 2023-08-03 DIAGNOSIS — R2681 Unsteadiness on feet: Secondary | ICD-10-CM | POA: Diagnosis not present

## 2023-08-03 DIAGNOSIS — M6281 Muscle weakness (generalized): Secondary | ICD-10-CM | POA: Diagnosis not present

## 2023-08-03 DIAGNOSIS — R1312 Dysphagia, oropharyngeal phase: Secondary | ICD-10-CM | POA: Diagnosis not present

## 2023-08-07 DIAGNOSIS — R2681 Unsteadiness on feet: Secondary | ICD-10-CM | POA: Diagnosis not present

## 2023-08-07 DIAGNOSIS — M6281 Muscle weakness (generalized): Secondary | ICD-10-CM | POA: Diagnosis not present

## 2023-08-07 DIAGNOSIS — I5033 Acute on chronic diastolic (congestive) heart failure: Secondary | ICD-10-CM | POA: Diagnosis not present

## 2023-08-07 DIAGNOSIS — R1312 Dysphagia, oropharyngeal phase: Secondary | ICD-10-CM | POA: Diagnosis not present

## 2023-08-07 DIAGNOSIS — R41841 Cognitive communication deficit: Secondary | ICD-10-CM | POA: Diagnosis not present

## 2023-08-08 DIAGNOSIS — R2681 Unsteadiness on feet: Secondary | ICD-10-CM | POA: Diagnosis not present

## 2023-08-08 DIAGNOSIS — M6281 Muscle weakness (generalized): Secondary | ICD-10-CM | POA: Diagnosis not present

## 2023-08-08 DIAGNOSIS — R41841 Cognitive communication deficit: Secondary | ICD-10-CM | POA: Diagnosis not present

## 2023-08-08 DIAGNOSIS — I5033 Acute on chronic diastolic (congestive) heart failure: Secondary | ICD-10-CM | POA: Diagnosis not present

## 2023-08-08 DIAGNOSIS — R1312 Dysphagia, oropharyngeal phase: Secondary | ICD-10-CM | POA: Diagnosis not present

## 2023-08-09 DIAGNOSIS — R41841 Cognitive communication deficit: Secondary | ICD-10-CM | POA: Diagnosis not present

## 2023-08-09 DIAGNOSIS — M6281 Muscle weakness (generalized): Secondary | ICD-10-CM | POA: Diagnosis not present

## 2023-08-09 DIAGNOSIS — I5033 Acute on chronic diastolic (congestive) heart failure: Secondary | ICD-10-CM | POA: Diagnosis not present

## 2023-08-09 DIAGNOSIS — R2681 Unsteadiness on feet: Secondary | ICD-10-CM | POA: Diagnosis not present

## 2023-08-09 DIAGNOSIS — R1312 Dysphagia, oropharyngeal phase: Secondary | ICD-10-CM | POA: Diagnosis not present

## 2023-08-10 DIAGNOSIS — K219 Gastro-esophageal reflux disease without esophagitis: Secondary | ICD-10-CM | POA: Diagnosis not present

## 2023-08-10 DIAGNOSIS — R059 Cough, unspecified: Secondary | ICD-10-CM | POA: Diagnosis not present

## 2023-08-10 DIAGNOSIS — J069 Acute upper respiratory infection, unspecified: Secondary | ICD-10-CM | POA: Diagnosis not present

## 2023-08-10 DIAGNOSIS — R42 Dizziness and giddiness: Secondary | ICD-10-CM | POA: Diagnosis not present

## 2023-08-10 DIAGNOSIS — R0989 Other specified symptoms and signs involving the circulatory and respiratory systems: Secondary | ICD-10-CM | POA: Diagnosis not present

## 2023-08-13 DIAGNOSIS — F411 Generalized anxiety disorder: Secondary | ICD-10-CM | POA: Diagnosis not present

## 2023-08-13 DIAGNOSIS — J069 Acute upper respiratory infection, unspecified: Secondary | ICD-10-CM | POA: Diagnosis not present

## 2023-08-13 DIAGNOSIS — I10 Essential (primary) hypertension: Secondary | ICD-10-CM | POA: Diagnosis not present

## 2023-08-15 DIAGNOSIS — R2681 Unsteadiness on feet: Secondary | ICD-10-CM | POA: Diagnosis not present

## 2023-08-15 DIAGNOSIS — R41841 Cognitive communication deficit: Secondary | ICD-10-CM | POA: Diagnosis not present

## 2023-08-15 DIAGNOSIS — M6281 Muscle weakness (generalized): Secondary | ICD-10-CM | POA: Diagnosis not present

## 2023-08-15 DIAGNOSIS — R1312 Dysphagia, oropharyngeal phase: Secondary | ICD-10-CM | POA: Diagnosis not present

## 2023-08-15 DIAGNOSIS — I5033 Acute on chronic diastolic (congestive) heart failure: Secondary | ICD-10-CM | POA: Diagnosis not present

## 2023-08-16 DIAGNOSIS — R1312 Dysphagia, oropharyngeal phase: Secondary | ICD-10-CM | POA: Diagnosis not present

## 2023-08-16 DIAGNOSIS — M6281 Muscle weakness (generalized): Secondary | ICD-10-CM | POA: Diagnosis not present

## 2023-08-16 DIAGNOSIS — I5033 Acute on chronic diastolic (congestive) heart failure: Secondary | ICD-10-CM | POA: Diagnosis not present

## 2023-08-16 DIAGNOSIS — R2681 Unsteadiness on feet: Secondary | ICD-10-CM | POA: Diagnosis not present

## 2023-08-16 DIAGNOSIS — R41841 Cognitive communication deficit: Secondary | ICD-10-CM | POA: Diagnosis not present

## 2023-08-17 DIAGNOSIS — R1312 Dysphagia, oropharyngeal phase: Secondary | ICD-10-CM | POA: Diagnosis not present

## 2023-08-17 DIAGNOSIS — R2681 Unsteadiness on feet: Secondary | ICD-10-CM | POA: Diagnosis not present

## 2023-08-17 DIAGNOSIS — R41841 Cognitive communication deficit: Secondary | ICD-10-CM | POA: Diagnosis not present

## 2023-08-17 DIAGNOSIS — I5033 Acute on chronic diastolic (congestive) heart failure: Secondary | ICD-10-CM | POA: Diagnosis not present

## 2023-08-17 DIAGNOSIS — M6281 Muscle weakness (generalized): Secondary | ICD-10-CM | POA: Diagnosis not present

## 2023-08-20 DIAGNOSIS — R2681 Unsteadiness on feet: Secondary | ICD-10-CM | POA: Diagnosis not present

## 2023-08-20 DIAGNOSIS — R41841 Cognitive communication deficit: Secondary | ICD-10-CM | POA: Diagnosis not present

## 2023-08-20 DIAGNOSIS — I5033 Acute on chronic diastolic (congestive) heart failure: Secondary | ICD-10-CM | POA: Diagnosis not present

## 2023-08-20 DIAGNOSIS — M6281 Muscle weakness (generalized): Secondary | ICD-10-CM | POA: Diagnosis not present

## 2023-08-20 DIAGNOSIS — R1312 Dysphagia, oropharyngeal phase: Secondary | ICD-10-CM | POA: Diagnosis not present

## 2023-08-21 DIAGNOSIS — R41841 Cognitive communication deficit: Secondary | ICD-10-CM | POA: Diagnosis not present

## 2023-08-21 DIAGNOSIS — I5033 Acute on chronic diastolic (congestive) heart failure: Secondary | ICD-10-CM | POA: Diagnosis not present

## 2023-08-21 DIAGNOSIS — R2681 Unsteadiness on feet: Secondary | ICD-10-CM | POA: Diagnosis not present

## 2023-08-21 DIAGNOSIS — M6281 Muscle weakness (generalized): Secondary | ICD-10-CM | POA: Diagnosis not present

## 2023-08-21 DIAGNOSIS — R1312 Dysphagia, oropharyngeal phase: Secondary | ICD-10-CM | POA: Diagnosis not present

## 2023-08-22 DIAGNOSIS — I5033 Acute on chronic diastolic (congestive) heart failure: Secondary | ICD-10-CM | POA: Diagnosis not present

## 2023-08-22 DIAGNOSIS — M6281 Muscle weakness (generalized): Secondary | ICD-10-CM | POA: Diagnosis not present

## 2023-08-22 DIAGNOSIS — R2681 Unsteadiness on feet: Secondary | ICD-10-CM | POA: Diagnosis not present

## 2023-08-22 DIAGNOSIS — R1312 Dysphagia, oropharyngeal phase: Secondary | ICD-10-CM | POA: Diagnosis not present

## 2023-08-28 DIAGNOSIS — R1312 Dysphagia, oropharyngeal phase: Secondary | ICD-10-CM | POA: Diagnosis not present

## 2023-08-28 DIAGNOSIS — R41841 Cognitive communication deficit: Secondary | ICD-10-CM | POA: Diagnosis not present

## 2023-08-28 DIAGNOSIS — R2681 Unsteadiness on feet: Secondary | ICD-10-CM | POA: Diagnosis not present

## 2023-08-28 DIAGNOSIS — M6281 Muscle weakness (generalized): Secondary | ICD-10-CM | POA: Diagnosis not present

## 2023-08-28 DIAGNOSIS — I5033 Acute on chronic diastolic (congestive) heart failure: Secondary | ICD-10-CM | POA: Diagnosis not present

## 2023-08-29 DIAGNOSIS — M545 Low back pain, unspecified: Secondary | ICD-10-CM | POA: Diagnosis not present

## 2023-08-29 DIAGNOSIS — R2681 Unsteadiness on feet: Secondary | ICD-10-CM | POA: Diagnosis not present

## 2023-08-29 DIAGNOSIS — F411 Generalized anxiety disorder: Secondary | ICD-10-CM | POA: Diagnosis not present

## 2023-08-29 DIAGNOSIS — I5033 Acute on chronic diastolic (congestive) heart failure: Secondary | ICD-10-CM | POA: Diagnosis not present

## 2023-08-29 DIAGNOSIS — R1312 Dysphagia, oropharyngeal phase: Secondary | ICD-10-CM | POA: Diagnosis not present

## 2023-08-29 DIAGNOSIS — M6281 Muscle weakness (generalized): Secondary | ICD-10-CM | POA: Diagnosis not present

## 2023-08-29 DIAGNOSIS — R41841 Cognitive communication deficit: Secondary | ICD-10-CM | POA: Diagnosis not present

## 2023-08-30 DIAGNOSIS — I10 Essential (primary) hypertension: Secondary | ICD-10-CM | POA: Diagnosis not present

## 2023-08-30 DIAGNOSIS — M545 Low back pain, unspecified: Secondary | ICD-10-CM | POA: Diagnosis not present

## 2023-09-05 DIAGNOSIS — M546 Pain in thoracic spine: Secondary | ICD-10-CM | POA: Diagnosis not present

## 2023-09-07 DIAGNOSIS — F4323 Adjustment disorder with mixed anxiety and depressed mood: Secondary | ICD-10-CM | POA: Diagnosis not present

## 2023-09-07 DIAGNOSIS — F5105 Insomnia due to other mental disorder: Secondary | ICD-10-CM | POA: Diagnosis not present

## 2023-09-11 DIAGNOSIS — M546 Pain in thoracic spine: Secondary | ICD-10-CM | POA: Diagnosis not present

## 2023-09-13 DIAGNOSIS — F5105 Insomnia due to other mental disorder: Secondary | ICD-10-CM | POA: Diagnosis not present

## 2023-09-13 DIAGNOSIS — F4323 Adjustment disorder with mixed anxiety and depressed mood: Secondary | ICD-10-CM | POA: Diagnosis not present

## 2023-09-21 DIAGNOSIS — L309 Dermatitis, unspecified: Secondary | ICD-10-CM | POA: Diagnosis not present

## 2023-09-27 DIAGNOSIS — M546 Pain in thoracic spine: Secondary | ICD-10-CM | POA: Diagnosis not present

## 2023-10-10 DIAGNOSIS — I5032 Chronic diastolic (congestive) heart failure: Secondary | ICD-10-CM | POA: Diagnosis not present

## 2023-10-10 DIAGNOSIS — M546 Pain in thoracic spine: Secondary | ICD-10-CM | POA: Diagnosis not present

## 2023-10-10 DIAGNOSIS — I10 Essential (primary) hypertension: Secondary | ICD-10-CM | POA: Diagnosis not present

## 2023-10-10 DIAGNOSIS — I480 Paroxysmal atrial fibrillation: Secondary | ICD-10-CM | POA: Diagnosis not present

## 2023-10-15 DIAGNOSIS — M545 Low back pain, unspecified: Secondary | ICD-10-CM | POA: Diagnosis not present

## 2023-10-15 DIAGNOSIS — I10 Essential (primary) hypertension: Secondary | ICD-10-CM | POA: Diagnosis not present

## 2023-10-15 DIAGNOSIS — I480 Paroxysmal atrial fibrillation: Secondary | ICD-10-CM | POA: Diagnosis not present

## 2023-10-15 DIAGNOSIS — I5032 Chronic diastolic (congestive) heart failure: Secondary | ICD-10-CM | POA: Diagnosis not present

## 2023-10-26 DIAGNOSIS — H6123 Impacted cerumen, bilateral: Secondary | ICD-10-CM | POA: Diagnosis not present

## 2023-10-30 DIAGNOSIS — H6123 Impacted cerumen, bilateral: Secondary | ICD-10-CM | POA: Diagnosis not present

## 2023-11-02 DIAGNOSIS — H6123 Impacted cerumen, bilateral: Secondary | ICD-10-CM | POA: Diagnosis not present

## 2023-11-02 DIAGNOSIS — F4323 Adjustment disorder with mixed anxiety and depressed mood: Secondary | ICD-10-CM | POA: Diagnosis not present

## 2023-11-05 DIAGNOSIS — H6123 Impacted cerumen, bilateral: Secondary | ICD-10-CM | POA: Diagnosis not present

## 2023-11-15 DIAGNOSIS — H6123 Impacted cerumen, bilateral: Secondary | ICD-10-CM | POA: Diagnosis not present

## 2023-11-21 DIAGNOSIS — H6123 Impacted cerumen, bilateral: Secondary | ICD-10-CM | POA: Diagnosis not present

## 2023-11-30 DIAGNOSIS — F419 Anxiety disorder, unspecified: Secondary | ICD-10-CM | POA: Diagnosis not present

## 2023-12-06 DIAGNOSIS — I5032 Chronic diastolic (congestive) heart failure: Secondary | ICD-10-CM | POA: Diagnosis not present

## 2023-12-06 DIAGNOSIS — M546 Pain in thoracic spine: Secondary | ICD-10-CM | POA: Diagnosis not present

## 2023-12-06 DIAGNOSIS — D61818 Other pancytopenia: Secondary | ICD-10-CM | POA: Diagnosis not present

## 2023-12-06 DIAGNOSIS — E119 Type 2 diabetes mellitus without complications: Secondary | ICD-10-CM | POA: Diagnosis not present

## 2023-12-10 DIAGNOSIS — N390 Urinary tract infection, site not specified: Secondary | ICD-10-CM | POA: Diagnosis not present

## 2023-12-11 DIAGNOSIS — B3731 Acute candidiasis of vulva and vagina: Secondary | ICD-10-CM | POA: Diagnosis not present

## 2023-12-13 DIAGNOSIS — B3731 Acute candidiasis of vulva and vagina: Secondary | ICD-10-CM | POA: Diagnosis not present

## 2023-12-18 DIAGNOSIS — B3731 Acute candidiasis of vulva and vagina: Secondary | ICD-10-CM | POA: Diagnosis not present

## 2023-12-20 DIAGNOSIS — N390 Urinary tract infection, site not specified: Secondary | ICD-10-CM | POA: Diagnosis not present

## 2023-12-21 DIAGNOSIS — R8290 Unspecified abnormal findings in urine: Secondary | ICD-10-CM | POA: Diagnosis not present

## 2023-12-21 DIAGNOSIS — I10 Essential (primary) hypertension: Secondary | ICD-10-CM | POA: Diagnosis not present

## 2023-12-24 DIAGNOSIS — N390 Urinary tract infection, site not specified: Secondary | ICD-10-CM | POA: Diagnosis not present

## 2023-12-28 DIAGNOSIS — N390 Urinary tract infection, site not specified: Secondary | ICD-10-CM | POA: Diagnosis not present

## 2024-01-01 DIAGNOSIS — N390 Urinary tract infection, site not specified: Secondary | ICD-10-CM | POA: Diagnosis not present

## 2024-01-11 DIAGNOSIS — N3281 Overactive bladder: Secondary | ICD-10-CM | POA: Diagnosis not present

## 2024-01-11 DIAGNOSIS — F4323 Adjustment disorder with mixed anxiety and depressed mood: Secondary | ICD-10-CM | POA: Diagnosis not present

## 2024-01-11 DIAGNOSIS — F411 Generalized anxiety disorder: Secondary | ICD-10-CM | POA: Diagnosis not present

## 2024-01-16 DIAGNOSIS — N3281 Overactive bladder: Secondary | ICD-10-CM | POA: Diagnosis not present

## 2024-01-28 DIAGNOSIS — G629 Polyneuropathy, unspecified: Secondary | ICD-10-CM | POA: Diagnosis not present

## 2024-01-30 DIAGNOSIS — G629 Polyneuropathy, unspecified: Secondary | ICD-10-CM | POA: Diagnosis not present

## 2024-02-01 DIAGNOSIS — G629 Polyneuropathy, unspecified: Secondary | ICD-10-CM | POA: Diagnosis not present

## 2024-02-14 DIAGNOSIS — G629 Polyneuropathy, unspecified: Secondary | ICD-10-CM | POA: Diagnosis not present

## 2024-02-14 DIAGNOSIS — K219 Gastro-esophageal reflux disease without esophagitis: Secondary | ICD-10-CM | POA: Diagnosis not present

## 2024-02-14 DIAGNOSIS — I5032 Chronic diastolic (congestive) heart failure: Secondary | ICD-10-CM | POA: Diagnosis not present

## 2024-02-14 DIAGNOSIS — I480 Paroxysmal atrial fibrillation: Secondary | ICD-10-CM | POA: Diagnosis not present

## 2024-02-21 DIAGNOSIS — I5033 Acute on chronic diastolic (congestive) heart failure: Secondary | ICD-10-CM | POA: Diagnosis not present

## 2024-02-21 DIAGNOSIS — M6281 Muscle weakness (generalized): Secondary | ICD-10-CM | POA: Diagnosis not present

## 2024-02-22 DIAGNOSIS — M6281 Muscle weakness (generalized): Secondary | ICD-10-CM | POA: Diagnosis not present

## 2024-02-22 DIAGNOSIS — I5033 Acute on chronic diastolic (congestive) heart failure: Secondary | ICD-10-CM | POA: Diagnosis not present

## 2024-02-25 DIAGNOSIS — M6281 Muscle weakness (generalized): Secondary | ICD-10-CM | POA: Diagnosis not present

## 2024-02-25 DIAGNOSIS — I5033 Acute on chronic diastolic (congestive) heart failure: Secondary | ICD-10-CM | POA: Diagnosis not present

## 2024-02-26 DIAGNOSIS — I5033 Acute on chronic diastolic (congestive) heart failure: Secondary | ICD-10-CM | POA: Diagnosis not present

## 2024-02-26 DIAGNOSIS — M6281 Muscle weakness (generalized): Secondary | ICD-10-CM | POA: Diagnosis not present

## 2024-02-27 DIAGNOSIS — M6281 Muscle weakness (generalized): Secondary | ICD-10-CM | POA: Diagnosis not present

## 2024-02-27 DIAGNOSIS — I5033 Acute on chronic diastolic (congestive) heart failure: Secondary | ICD-10-CM | POA: Diagnosis not present

## 2024-03-05 DIAGNOSIS — M545 Low back pain, unspecified: Secondary | ICD-10-CM | POA: Diagnosis not present

## 2024-03-05 DIAGNOSIS — L309 Dermatitis, unspecified: Secondary | ICD-10-CM | POA: Diagnosis not present

## 2024-03-05 DIAGNOSIS — F411 Generalized anxiety disorder: Secondary | ICD-10-CM | POA: Diagnosis not present

## 2024-03-07 DIAGNOSIS — M545 Low back pain, unspecified: Secondary | ICD-10-CM | POA: Diagnosis not present

## 2024-03-07 DIAGNOSIS — F411 Generalized anxiety disorder: Secondary | ICD-10-CM | POA: Diagnosis not present

## 2024-03-07 DIAGNOSIS — L309 Dermatitis, unspecified: Secondary | ICD-10-CM | POA: Diagnosis not present

## 2024-03-12 DIAGNOSIS — L602 Onychogryphosis: Secondary | ICD-10-CM | POA: Diagnosis not present

## 2024-03-12 DIAGNOSIS — L603 Nail dystrophy: Secondary | ICD-10-CM | POA: Diagnosis not present

## 2024-03-12 DIAGNOSIS — M21612 Bunion of left foot: Secondary | ICD-10-CM | POA: Diagnosis not present

## 2024-03-12 DIAGNOSIS — I739 Peripheral vascular disease, unspecified: Secondary | ICD-10-CM | POA: Diagnosis not present

## 2024-03-14 DIAGNOSIS — F515 Nightmare disorder: Secondary | ICD-10-CM | POA: Diagnosis not present

## 2024-03-14 DIAGNOSIS — F411 Generalized anxiety disorder: Secondary | ICD-10-CM | POA: Diagnosis not present

## 2024-03-14 DIAGNOSIS — F5105 Insomnia due to other mental disorder: Secondary | ICD-10-CM | POA: Diagnosis not present

## 2024-03-20 DIAGNOSIS — L309 Dermatitis, unspecified: Secondary | ICD-10-CM | POA: Diagnosis not present

## 2024-04-04 DIAGNOSIS — F411 Generalized anxiety disorder: Secondary | ICD-10-CM | POA: Diagnosis not present

## 2024-04-04 DIAGNOSIS — I5032 Chronic diastolic (congestive) heart failure: Secondary | ICD-10-CM | POA: Diagnosis not present

## 2024-04-04 DIAGNOSIS — K219 Gastro-esophageal reflux disease without esophagitis: Secondary | ICD-10-CM | POA: Diagnosis not present

## 2024-04-04 DIAGNOSIS — G629 Polyneuropathy, unspecified: Secondary | ICD-10-CM | POA: Diagnosis not present

## 2024-04-24 DIAGNOSIS — K219 Gastro-esophageal reflux disease without esophagitis: Secondary | ICD-10-CM | POA: Diagnosis not present

## 2024-04-24 DIAGNOSIS — F411 Generalized anxiety disorder: Secondary | ICD-10-CM | POA: Diagnosis not present

## 2024-04-24 DIAGNOSIS — I5032 Chronic diastolic (congestive) heart failure: Secondary | ICD-10-CM | POA: Diagnosis not present

## 2024-05-02 DIAGNOSIS — L309 Dermatitis, unspecified: Secondary | ICD-10-CM | POA: Diagnosis not present

## 2024-05-08 DIAGNOSIS — F411 Generalized anxiety disorder: Secondary | ICD-10-CM | POA: Diagnosis not present

## 2024-06-08 ENCOUNTER — Emergency Department (HOSPITAL_COMMUNITY)

## 2024-06-08 ENCOUNTER — Other Ambulatory Visit: Payer: Self-pay

## 2024-06-08 ENCOUNTER — Emergency Department (HOSPITAL_COMMUNITY)
Admission: EM | Admit: 2024-06-08 | Discharge: 2024-06-09 | Disposition: A | Discharge status: Home / Self Care | Attending: Emergency Medicine | Admitting: Emergency Medicine

## 2024-06-08 ENCOUNTER — Encounter (HOSPITAL_COMMUNITY): Payer: Self-pay | Admitting: Emergency Medicine

## 2024-06-08 DIAGNOSIS — I110 Hypertensive heart disease with heart failure: Secondary | ICD-10-CM | POA: Insufficient documentation

## 2024-06-08 DIAGNOSIS — S32000G Wedge compression fracture of unspecified lumbar vertebra, subsequent encounter for fracture with delayed healing: Secondary | ICD-10-CM

## 2024-06-08 DIAGNOSIS — Z7982 Long term (current) use of aspirin: Secondary | ICD-10-CM | POA: Insufficient documentation

## 2024-06-08 DIAGNOSIS — Z79899 Other long term (current) drug therapy: Secondary | ICD-10-CM | POA: Diagnosis not present

## 2024-06-08 DIAGNOSIS — I5033 Acute on chronic diastolic (congestive) heart failure: Secondary | ICD-10-CM | POA: Insufficient documentation

## 2024-06-08 DIAGNOSIS — S32028A Other fracture of second lumbar vertebra, initial encounter for closed fracture: Secondary | ICD-10-CM | POA: Diagnosis present

## 2024-06-08 DIAGNOSIS — S32048G Other fracture of fourth lumbar vertebra, subsequent encounter for fracture with delayed healing: Secondary | ICD-10-CM | POA: Diagnosis not present

## 2024-06-08 LAB — COMPREHENSIVE METABOLIC PANEL WITH GFR
ALT: 6 U/L (ref 0–44)
AST: 19 U/L (ref 15–41)
Albumin: 3.9 g/dL (ref 3.5–5.0)
Alkaline Phosphatase: 83 U/L (ref 38–126)
Anion gap: 10 (ref 5–15)
BUN: 14 mg/dL (ref 8–23)
CO2: 28 mmol/L (ref 22–32)
Calcium: 9.6 mg/dL (ref 8.9–10.3)
Chloride: 95 mmol/L — ABNORMAL LOW (ref 98–111)
Creatinine, Ser: 0.7 mg/dL (ref 0.44–1.00)
GFR, Estimated: >60 mL/min
Glucose, Bld: 83 mg/dL (ref 70–99)
Potassium: 4.3 mmol/L (ref 3.5–5.1)
Sodium: 134 mmol/L — ABNORMAL LOW (ref 135–145)
Total Bilirubin: 0.5 mg/dL (ref 0.0–1.2)
Total Protein: 6.3 g/dL — ABNORMAL LOW (ref 6.5–8.1)

## 2024-06-08 LAB — CBC WITH DIFFERENTIAL/PLATELET
Abs Immature Granulocytes: 0.01 K/uL (ref 0.00–0.07)
Basophils Absolute: 0 K/uL (ref 0.0–0.1)
Basophils Relative: 1 %
Eosinophils Absolute: 0.1 K/uL (ref 0.0–0.5)
Eosinophils Relative: 3 %
HCT: 40.5 % (ref 36.0–46.0)
Hemoglobin: 13.1 g/dL (ref 12.0–15.0)
Immature Granulocytes: 0 %
Lymphocytes Relative: 33 %
Lymphs Abs: 1.7 K/uL (ref 0.7–4.0)
MCH: 28.9 pg (ref 26.0–34.0)
MCHC: 32.3 g/dL (ref 30.0–36.0)
MCV: 89.4 fL (ref 80.0–100.0)
Monocytes Absolute: 0.5 K/uL (ref 0.1–1.0)
Monocytes Relative: 10 %
Neutro Abs: 2.7 K/uL (ref 1.7–7.7)
Neutrophils Relative %: 53 %
Platelets: 186 K/uL (ref 150–400)
RBC: 4.53 MIL/uL (ref 3.87–5.11)
RDW: 13.3 % (ref 11.5–15.5)
WBC: 5.1 K/uL (ref 4.0–10.5)
nRBC: 0 % (ref 0.0–0.2)

## 2024-06-08 LAB — URINALYSIS, ROUTINE W REFLEX MICROSCOPIC
Bacteria, UA: NONE SEEN
Bilirubin Urine: NEGATIVE
Glucose, UA: >=500 mg/dL — AB
Hgb urine dipstick: NEGATIVE
Ketones, ur: 20 mg/dL — AB
Nitrite: NEGATIVE
Protein, ur: NEGATIVE mg/dL
Specific Gravity, Urine: 1.01 (ref 1.005–1.030)
pH: 6 (ref 5.0–8.0)

## 2024-06-08 MED ORDER — MECLIZINE HCL 25 MG PO TABS
25.0000 mg | ORAL_TABLET | Freq: Once | ORAL | Status: AC
  Administered 2024-06-08: 25 mg via ORAL
  Filled 2024-06-08: qty 1

## 2024-06-08 MED ORDER — ONDANSETRON HCL 4 MG/2ML IJ SOLN
4.0000 mg | Freq: Once | INTRAMUSCULAR | Status: DC
  Filled 2024-06-08: qty 2

## 2024-06-08 MED ORDER — ONDANSETRON 4 MG PO TBDP
4.0000 mg | ORAL_TABLET | Freq: Once | ORAL | Status: AC
  Administered 2024-06-08: 4 mg via ORAL
  Filled 2024-06-08: qty 1
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
## 2024-06-08 NOTE — Discharge Instructions (Addendum)
 You have compression fractures at your L2 and L4 vertebrae in your back.  Unfortunately, these have been there for some time.  Including your discharge paperwork, I have included the telephone number for a spine doctor.  You may call them this week for a follow-up appointment.  Discuss pain medications with the physician at your skilled nursing facility.  Return to the emergency room if you develop increased weakness in your legs, inability to use the bathroom or numbness in your legs.  I recommend using a wheelchair as needed until you follow-up with the spine doctor.  I would also avoid physical therapy until you follow-up with the spine doctor.
